# Patient Record
Sex: Male | Born: 1935 | Race: White | Hispanic: No | Marital: Married | Smoking: Current every day smoker
Health system: Southern US, Community
[De-identification: ages and names within clinical notes are randomized; demographics above are authoritative.]

## PROBLEM LIST (undated history)

## (undated) DIAGNOSIS — L409 Psoriasis, unspecified: Secondary | ICD-10-CM

## (undated) DIAGNOSIS — E039 Hypothyroidism, unspecified: Secondary | ICD-10-CM

## (undated) DIAGNOSIS — I639 Cerebral infarction, unspecified: Secondary | ICD-10-CM

## (undated) DIAGNOSIS — E785 Hyperlipidemia, unspecified: Secondary | ICD-10-CM

## (undated) DIAGNOSIS — I739 Peripheral vascular disease, unspecified: Secondary | ICD-10-CM

## (undated) DIAGNOSIS — I2510 Atherosclerotic heart disease of native coronary artery without angina pectoris: Secondary | ICD-10-CM

## (undated) DIAGNOSIS — I10 Essential (primary) hypertension: Secondary | ICD-10-CM

## (undated) DIAGNOSIS — K219 Gastro-esophageal reflux disease without esophagitis: Secondary | ICD-10-CM

## (undated) DIAGNOSIS — C801 Malignant (primary) neoplasm, unspecified: Secondary | ICD-10-CM

## (undated) DIAGNOSIS — J449 Chronic obstructive pulmonary disease, unspecified: Secondary | ICD-10-CM

## (undated) DIAGNOSIS — J45909 Unspecified asthma, uncomplicated: Secondary | ICD-10-CM

## (undated) DIAGNOSIS — R0602 Shortness of breath: Secondary | ICD-10-CM

## (undated) HISTORY — DX: Hyperlipidemia, unspecified: E78.5

## (undated) HISTORY — DX: Psoriasis, unspecified: L40.9

## (undated) HISTORY — DX: Peripheral vascular disease, unspecified: I73.9

## (undated) HISTORY — DX: Malignant (primary) neoplasm, unspecified: C80.1

## (undated) HISTORY — DX: Essential (primary) hypertension: I10

## (undated) HISTORY — DX: Shortness of breath: R06.02

## (undated) HISTORY — DX: Cerebral infarction, unspecified: I63.9

## (undated) HISTORY — PX: PROSTATECTOMY: SHX69

## (undated) HISTORY — PX: LOWER EXTREMITY ANGIOGRAM: SHX5955

## (undated) HISTORY — PX: VASCULAR SURGERY: SHX849

## (undated) HISTORY — PX: OTHER SURGICAL HISTORY: SHX169

## (undated) HISTORY — PX: CHOLECYSTECTOMY: SHX55

---

## 2001-12-08 HISTORY — PX: CAROTID ENDARTERECTOMY: SUR193

## 2004-12-08 HISTORY — PX: CAROTID ENDARTERECTOMY: SUR193

## 2005-03-26 ENCOUNTER — Encounter (INDEPENDENT_AMBULATORY_CARE_PROVIDER_SITE_OTHER): Payer: Self-pay | Admitting: *Deleted

## 2005-03-26 ENCOUNTER — Inpatient Hospital Stay (HOSPITAL_COMMUNITY): Admission: RE | Admit: 2005-03-26 | Discharge: 2005-03-27 | Payer: Self-pay | Admitting: Vascular Surgery

## 2005-11-06 ENCOUNTER — Ambulatory Visit (HOSPITAL_COMMUNITY): Admission: RE | Admit: 2005-11-06 | Discharge: 2005-11-06 | Payer: Self-pay | Admitting: Vascular Surgery

## 2007-03-02 ENCOUNTER — Ambulatory Visit: Payer: Self-pay | Admitting: Vascular Surgery

## 2007-03-11 ENCOUNTER — Ambulatory Visit: Payer: Self-pay | Admitting: Vascular Surgery

## 2007-03-11 ENCOUNTER — Ambulatory Visit (HOSPITAL_COMMUNITY): Admission: RE | Admit: 2007-03-11 | Discharge: 2007-03-11 | Payer: Self-pay | Admitting: Vascular Surgery

## 2007-03-17 ENCOUNTER — Encounter (INDEPENDENT_AMBULATORY_CARE_PROVIDER_SITE_OTHER): Payer: Self-pay | Admitting: Specialist

## 2007-03-17 ENCOUNTER — Inpatient Hospital Stay (HOSPITAL_COMMUNITY): Admission: RE | Admit: 2007-03-17 | Discharge: 2007-03-19 | Payer: Self-pay | Admitting: Vascular Surgery

## 2007-04-13 ENCOUNTER — Ambulatory Visit: Payer: Self-pay | Admitting: Vascular Surgery

## 2007-05-11 ENCOUNTER — Ambulatory Visit: Payer: Self-pay | Admitting: Vascular Surgery

## 2007-07-05 ENCOUNTER — Ambulatory Visit: Payer: Self-pay | Admitting: Vascular Surgery

## 2007-09-20 ENCOUNTER — Ambulatory Visit: Payer: Self-pay | Admitting: Vascular Surgery

## 2008-03-20 ENCOUNTER — Ambulatory Visit: Payer: Self-pay | Admitting: Vascular Surgery

## 2008-09-25 ENCOUNTER — Ambulatory Visit: Payer: Self-pay | Admitting: Vascular Surgery

## 2009-04-03 ENCOUNTER — Ambulatory Visit: Payer: Self-pay | Admitting: Vascular Surgery

## 2009-09-25 ENCOUNTER — Ambulatory Visit: Payer: Self-pay | Admitting: Vascular Surgery

## 2009-12-08 DIAGNOSIS — C801 Malignant (primary) neoplasm, unspecified: Secondary | ICD-10-CM

## 2009-12-08 HISTORY — DX: Malignant (primary) neoplasm, unspecified: C80.1

## 2009-12-08 HISTORY — PX: NEPHRECTOMY: SHX65

## 2011-04-22 NOTE — Procedures (Signed)
BYPASS GRAFT EVALUATION   INDICATION:  Followup peripheral vascular disease.   HISTORY:  Diabetes:  No.  Cardiac:  No.  Hypertension:  Yes.  Smoking:  Yes.  Previous Surgery:  Bilateral distal external iliac, common femoral,  profunda femoral, superficial femoral artery endarterectomies on  03/17/2007.   SINGLE LEVEL ARTERIAL EXAM                               RIGHT              LEFT  Brachial:                    165                170  Anterior tibial:             125                132  Posterior tibial:            113                132  Peroneal:  Ankle/brachial index:        0.74               0.78   PREVIOUS ABI:  Date: 03/20/2004  RIGHT:  0.59  LEFT:  0.66   LOWER EXTREMITY BYPASS GRAFT DUPLEX EXAM:   DUPLEX:  Biphasic to monophasic Doppler waveforms noted throughout the  bilateral distal external iliac, common femoral, profunda femoral, and  superficial femoral arteries with increased velocities of 274 cm/s noted  in the right distal external iliac artery and 306 cm/s noted in the left  distal external iliac artery.   IMPRESSION:  1. Patent bilateral endarterectomy sites with increased velocities      noted, as described above.  2. Stable bilateral ankle-brachial indices.   ___________________________________________  Quita Skye Hart Rochester, M.D.   CH/MEDQ  D:  09/25/2008  T:  09/25/2008  Job:  [id number]

## 2011-04-22 NOTE — Assessment & Plan Note (Signed)
OFFICE VISIT   BATTISTE, Nalu C  DOB:  1933-11-08                                       05/11/2007  ZOXWR#:[id number]   The patient is now 2 months status post bilateral extensive distal  external iliac, common femoral, proximal superficial femoral and  profundus femoris endarterectomy with Dacron patch angioplasties.  He  had an excellent clinical result with complete resolution of his  claudication symptoms.  He had some slow wound healing in the right  inguinal crease which looks much better today.  There is no evidence of  any purulence or deep infection or any other significant abnormalities  related to his arterial surgery.  He does have some mild edema in his  left leg distally which had improved since surgery.  He was encouraged  to continue to increase his activity as tolerated and will return to see  [place] on a p.r.n. basis.   Quita Skye Hart Rochester, M.D.  Electronically Signed   JDL/MEDQ  D:  05/11/2007  T:  05/12/2007  Job:  [id number]

## 2011-04-25 NOTE — Op Note (Signed)
[name], [name]               ACCOUNT NO.:  [id number]   MEDICAL RECORD NO.:  [id number]          PATIENT TYPE:  AMB   LOCATION:  SDS                          FACILITY:  [facility]   PHYSICIAN:  Quita Skye. Hart Rochester, M.D.  DATE OF BIRTH:  1933-08-01   DATE OF PROCEDURE:  11/06/2005  DATE OF DISCHARGE:  11/06/2005                                 OPERATIVE REPORT   PREOPERATIVE DIAGNOSIS:  Bilateral lower extremity claudication secondary to  aortoiliac and femoropopliteal occlusive disease.   PROCEDURE:  1.  Abdominal aortogram with bilateral lower extremity runoff via right      common femoral approach.  2.  Measurement of pressure gradient, right iliac system -- 8-mm gradient   SURGEON:  Quita Skye. Hart Rochester, M.D.   ANESTHESIA:  Local with Xylocaine.   CONTRAST:  182 mL.   COMPLICATIONS:  None.   DESCRIPTION OF PROCEDURE:  The patient was taken to the [facility]  Peripheral Endovascular Lab and placed in the supine position, at which time  both groins were prepped with Betadine scrubbing solution and draped in  routine sterile manner.  After infiltration with 1% Xylocaine, the right  common femoral artery was entered percutaneously and guidewire passed into  the suprarenal aorta under fluoroscopic guidance.  A 5-French sheath and  dilator were passed over the guidewire, dilator removed and a standard  pigtail catheter positioned in the suprarenal aorta.  Flush abdominal  aortogram was performed, injecting 20 mL of contrast at 20 mL per second.  This revealed the aorta to be widely patent, but mildly diffusely diseased  with atherosclerosis.  Right renal artery was widely patent with no  significant stenosis.  There was an approximate 70% stenosis of the proximal  portion of the left renal artery about 1 cm distal to the ostium.  The  inferior mesenteric artery was faintly visualized.  Both common iliac  arteries were diffusely diseased with mild atherosclerotic changes, but no  significant stenoses were noted.  The right internal iliac artery had a  severe stenosis at its origin and was diffusely diseased.  Additional views  of the renal arteries were obtained on magnification views with 1 additional  injection of 15 degrees of LAO projection confirming the above findings.  Catheter was withdrawn into the terminal aorta and bilateral lower extremity  runoff performed, injecting 88 mL of contrast at 8 mL per second.  Additional views of the iliac arteries were obtained using RAO and LAO  projections and also additional views of the popliteal arteries bilaterally  and the proximal runoff were obtained using the peakhold technique.  Both  common and external iliac arteries were diffusely mildly diseased, but no  significant stenoses noted.  On the right side, the superficial femoral  artery was diffusely diseased with mild plaque formation proximally and an  80% stenosis in the mid-thigh in the middle of some diffuse plaque.  The  right popliteal artery had an approximate 60% to 70% stenosis just above the  knee joint, but was smooth below the knee and the primary runoff on the  right was through the  peroneal artery with severe tibial disease.  On the  left side, there was a similar finding with the common femoral and profunda  femoris arteries be widely patent.  The superficial femoral artery had mild  diffuse disease with some focal stenotic lesion in the mid-thigh  approximating 80%.  There was a 70% stenosis just above the knee joint on  the left in the popliteal artery, with the below-knee popliteal artery being  widely patent and two-vessel runoff through the posterior tibial and  peroneal arteries with diffuse disease.  Following this, a pressure gradient  of the right iliac system was measured from the aorta to the common femoral  level with only an 8-mm gradient being present and no change in the wave  form characteristics.  Having tolerated the procedure  well, the sheath was  removed, adequate compression applied and no complications ensued.   FINDINGS:  1.  Mild diffuse aortoiliac occlusive disease with no significant stenoses.  2.  Severe stenosis, origin of right internal iliac artery.  3.  Seventy percent proximal left renal artery stenosis.  4.  Bilateral moderate to moderately severe superficial femoral occlusive      disease with 80% stenoses in the mid-thigh bilaterally with diffuse      plaque.  5.  Severe bilateral tibial disease with primarily one-vessel runoff on the      right through the peroneal and two-vessel runoff on the left through the      peroneal and posterior tibial arteries.           ______________________________  Quita Skye Hart Rochester, M.D.     JDL/MEDQ  D:  11/06/2005  T:  11/07/2005  Job:  [id number]

## 2011-04-25 NOTE — Op Note (Signed)
NAME:  [name], [name]               ACCOUNT NO.:  [id number]   MEDICAL RECORD NO.:  [id number]          PATIENT TYPE:  AMB   LOCATION:  SDS                          FACILITY:  [facility]   PHYSICIAN:  Quita Skye. Hart Rochester, M.D.  DATE OF BIRTH:  [birth year]   DATE OF PROCEDURE:  03/11/2007  DATE OF DISCHARGE:                               OPERATIVE REPORT   PREOPERATIVE DIAGNOSIS:  Severe claudication both lower extremities  secondary to aortoiliac femoral popliteal and tibial occlusive disease.   PROCEDURE:  1. Abdominal aortogram with bilateral lower extremity one and runoff      via right common femoral approach.   SURGEON:  Quita Skye. Hart Rochester, M.D.   ANESTHESIA:  Local Xylocaine.   CONTRAST:  225 mL.   COMPLICATIONS:  None.   DESCRIPTION OF PROCEDURE:  The patient was taken to [facility]  peripheral endovascular lab, placed in the supine position at which time  both groins were prepped with Betadine scrub solution draped in a  routine sterile manner.  After infiltration of 1% Xylocaine, right  common femoral artery was entered percutaneously.  Guidewire passed into  the suprarenal aorta under fluoroscopic guidance.  A 5-French sheath and  dilator were passed over the guide wire, dilator removed.  A standard  pigtail catheter positioned in the suprarenal aorta.  Flush abdominal  aortogram was performed at 20 mL contrast at 20 mL per second.  This  revealed the aorta to be patent but diffusely diseased with  atherosclerotic changes.  There are single renal arteries bilaterally  with approximate 60 to 70% proximal left renal artery stenosis.  Better  views of this were obtained by using a LAO projection and magnified  views.  The terminal aorta had no significant stenosis but was diffusely  involved with plaque.  Catheter was withdrawn into the terminal aorta  and pelvic angiography performed which revealed the common internal and  external iliacs to be patent but diffusely diseased with  no severe  stenotic lesions.  Following this bilateral lower extremity runoff was  performed at 88 mL of contrast at 8 mL per second.  This revealed the  right common femoral to have severe occlusive disease distally involving  the origin of the superficial femoral and the profunda.  This was  causing what appeared to be an 80% stenosis.  The right superficial  femoral artery was diffusely diseased with some 70-80% stenotic lesions  but not occluded in any area below-knee popliteal artery was patent and  there was essentially one-vessel runoff through the peroneal artery.  On  the left there was a similar picture with severe common femoral disease  down involving the origin of the superficial femoral artery.  Left  superficial femoral artery was patent throughout with diffuse disease  and multiple stenotic lesions and popliteal artery below the knee was  patent with two-vessel runoff to the peroneal and posterior tibial  arteries.  Additional views of the popliteal were obtained using the  peak hold technique and LAO and RAO projections of the iliacs were also  obtained.  Having tolerated procedure well, the catheter  was removed  over the guidewire, sheath removed, adequate compression applied.  No  complications ensued.   FINDINGS:  1. Diffuse aortoiliac occlusive disease with no stenotic lesions.  2. 60 to 70% left renal artery stenosis.  3. Severe bilateral common femoral stenotic lesions in the 70-80%      range.  4. Diffuse superficial femoral occlusive disease with no total      occlusions bilaterally.  5. Severe tibial disease with one-vessel runoff on the right via      peroneal and two-vessel runoff on the left via peroneal and      posterior tibial artery.      Quita Skye Hart Rochester, M.D.  Electronically Signed     JDL/MEDQ  D:  03/11/2007  T:  03/11/2007  Job:  [id number]

## 2011-04-25 NOTE — Op Note (Signed)
[name], MALECHA NO.:  [id number]   MEDICAL RECORD NO.:  [id number]          PATIENT TYPE:  INP   LOCATION:  2899                         FACILITY:  [facility]   PHYSICIAN:  Quita Skye. Hart Rochester, M.D.  DATE OF BIRTH:  [birth year]   DATE OF PROCEDURE:  DATE OF DISCHARGE:                                 OPERATIVE REPORT   BRIEF HISTORY:  The patient is previous, right carotid endarterectomy in  [place], [place] 3 years ago; and has been known to have moderate  left internal carotid flow reduction. A repeat duplex scan, now, reveals a  very severe left internal carotid stenosis, exceeding 95% with no evidence  of flow reduction in the right carotid endarterectomy site. He was scheduled  for left carotid endarterectomy of this asymptomatic lesion.   PREOP DIAGNOSIS:  Left internal carotid stenosis -- asymptomatic.   POSTOP DIAGNOSIS:  Left internal carotid stenosis -- asymptomatic.   OPERATION:  Left carotid endarterectomy with Dacron patch angioplasty.   SURGEON:  Dr. Hart Rochester   FIRST ASSISTANT:  Janetta Hora. Fields, MD   SECOND ASSISTANT:  Rowe Clack, P.A.-C.   ANESTHESIA:  General endotracheal.   PROCEDURE:  The patient was taken to the operating room, placed in the  supine position, at which time satisfactory general endotracheal anesthesia  was administered. Left neck was prepped with Betadine scrub and solution  draped in a routine sterile manner. Incision was made along the anterior  border of the sternocleidomastoid muscle and carried down through  subcutaneous tissue and platysma using the Bovie. The common facial vein and  external jugular vein were ligated with 3-0 silk ties and divided; exposing  the common internal and external carotid arteries. Care was taken not to  injure the vagus or hypoglossal nerves both of which were exposed. There was  a heavily calcified plaque at the origin of the internal carotid artery  extending up about 3 cm.   Distal vessel appeared normal, but had no palpable  pulse. A #10 shunt was prepared; and the patient was heparinized.   Carotid vessels were occluded with vascular clamps. Longitudinal opening  made in the common carotid with the 15 blade; and extended up the internal  carotid with the Potts scissors to a point distal to the disease. The plaque  was at least 90-95% stenotic in severity. There was excellent back bleeding  coming from the distal vessel. A #10 shunt was inserted about difficulty  reestablishing flow in about 2 minutes. Standard endarterectomy was then  performed using the elevator and the Potts scissors with an inversion  endarterectomy of the external carotid. The plaque feathered off the distal  internal carotid artery, nicely, not requiring any tacking sutures.   The lumen was thoroughly irrigated with heparin and saline; and all loose  debris carefully removed and the arteriotomy was closed with a patch using  continuous 6-0 Prolene. Prior to completion of the closure the shunt was  removed after about 30 minutes of shunt time. Following antegrade and  retrograde flushing, the closure was completed reestablishing the flow up  the external  branch and then up the internal branch. Carotid was occluded  for  less than 2 minutes for removal of the shunt. Protamine was given to reverse  the heparin. Following adequate hemostasis the wound was irrigated with  saline and closed in layers with Vicryl in a subcuticular fashion. Sterile  dressing applied. The patient taken to recovery room in a satisfactory  condition.      JDL/MEDQ  D:  03/26/2005  T:  03/26/2005  Job:  [id number]

## 2011-04-25 NOTE — Discharge Summary (Signed)
NAME:  [name], [name] NO.:  [id number]   MEDICAL RECORD NO.:  [id number]          PATIENT TYPE:  INP   LOCATION:  2007                         FACILITY:  [facility]   PHYSICIAN:  Quita Skye. Hart Rochester, M.D.  DATE OF BIRTH:  01-20-1933   DATE OF ADMISSION:  03/17/2007  DATE OF DISCHARGE:                               DISCHARGE SUMMARY   DISCHARGE DATE:  Tentatively 1-2 days.   ADMISSION DIAGNOSIS:  Severe claudication both lower extremities  secondary to aortoiliac femoral popliteal and tibial occlusive disease.   DISCHARGE DIAGNOSES:  1. Aortoiliac femoral popliteal and tibial occlusive disease, status      post bilateral common femoral endarterectomy with Dacron patch      angioplasty.  2. Extracranial cerebrovascular occlusive disease.  3. Hypertension.  4. Hyperlipidemia.  5. History of transient ischemic attack.  6. Psoriasis.   PROCEDURE:  On March 17, 2007, patient underwent external iliac artery  and common femoral artery and profunda endarterectomies with Dacron  patch angioplasty by Dr. Quita Skye. Hart Rochester.   HISTORY AND PHYSICAL:  This is a 77 year old white male with diffuse  bilateral peripheral vascular disease with complaints of bilateral lower  extremity claudication symptoms.  Patient was experiencing this over the  past 14 months and it has increased.  He recently underwent an abdominal  aortogram by Dr. Hart Rochester, which showed diffuse aortoiliac occlusive  disease and severe bilateral common femoral stenotic lesion in the 70-  80% range.  Please see dictated history and physical for further  details.   HOSPITAL COURSE:  Patient underwent bilateral external iliac artery,  common femoral artery, superficial femoral artery and profunda  endarterectomies by Dr. Hart Rochester without any complications.  Postoperatively, the patient is progressing quite well.  Postop day  number 1, patient's pain is well controlled.  His vital signs remained  stable.  He remains  afebrile.  Blood pressure 92/40 and he remains in  normal sinus rhythm.  Patient's JP drain is draining less than 5 mL over  the past 8 hours and this will be discontinued.  Patient does have  wheezes on exam, he will be given a Xopenex nebulizer treatment.  He is  to continue incentive spirometry aggressively and he will be given a  flutter valve.   The patient did have some acute blood loss anemia; however, he has not  required any blood transfusions.  Hemoglobin and hematocrit remain  stable.  Patient is to ambulate today with assistance.  Smoking  cessation was discussed with the patient.  He will have ABIs today and  transferred to 2000.   DISCHARGE DISPOSITION:  The patient will be discharged home in the next  1-2 days provided he remains clinically stable and is able to ambulate  at baseline.   MEDICATIONS:  1. Methotrexate 2.5 mg p.o. every Monday.  2. Zocor 20 mg 1/2 tab q.h.s.  3. Lisinopril 20 mg 1/2 tab daily.  4. Aspirin 81 mg p.o. daily.  5. Tylox 1-2 tabs every 4 hours p.r.n.   INSTRUCTIONS:  The patient instructed to follow a low-fat, low-salt  diet.  No  driving or heavy lifting greater than 10 pounds for 3 weeks.  Patient is to ambulate 3-4 times daily.  He is increase activity as  tolerated.  He may shower and clean his incisions with mild soap and  water.  He is to call our office if any problems shall arise such as  incision erythema, drainage, temp greater than 101.5.   FOLLOWUP:  The patient will follow up with Dr. Hart Rochester in 3 weeks with  ABIs, the office will contact him with time and day of appointment.      Constance Holster, PA      Quita Skye. Hart Rochester, M.D.  Electronically Signed    JMW/MEDQ  D:  03/18/2007  T:  03/18/2007  Job:  [id number]

## 2011-04-25 NOTE — H&P (Signed)
NAME:  [name] [name] NO.:  [id number]   MEDICAL RECORD NO.:  [id number]          PATIENT TYPE:  INP   LOCATION:                               FACILITY:  [facility]   PHYSICIAN:  Quita Skye. Hart Rochester, M.D.  DATE OF BIRTH:  1933-01-30   DATE OF ADMISSION:  03/26/2005  DATE OF DISCHARGE:                                HISTORY & PHYSICAL   CHIEF COMPLAINT:  Severe left internal carotid stenosis - asymptomatic.   HISTORY OF PRESENT ILLNESS:  [name] [name] is a 77 year old male patient who  has a history of right carotid endarterectomy performed at [place], [place], three years ago which was diagnosed after he had a presyncopal  type episode.  He has had some known moderate left internal carotid flow  reduction and has had no symptoms since his previous surgical procedure  including hemispheric or nonhemispheric TIAs, amaurosis fugax, diplopia,  blurred vision or syncope.  He had a carotid Duplex exam performed in  [place], [place], on February 26, 2005, which revealed a 95+% left  internal carotid stenosis and a widely patent right carotid endarterectomy  site.  This study was repeated at the [facility] office on March 25, 2005, with  similar findings and after discussing this with the patient, he decided to  proceed with left carotid surgery.   ALLERGIES:  PENICILLIN.   MEDICATIONS:  1.  Lisinopril 10 mg once a day.  2.  Simvastatin 20 mg once a day.  3.  Methotrexate 2.5 mg once a week.  4.  Aspirin 325 mg once a day.   PAST MEDICAL HISTORY:  1.  Hypertension.  2.  Negative for diabetes, coronary artery disease, congestive heart      failure, COPD or deep venous thrombosis.  3.  He does have a history of hyperlipidemia.   PAST SURGICAL HISTORY:  1.  Right carotid endarterectomy.  2.  Cholecystectomy.   FAMILY HISTORY:  Positive for coronary artery disease in his father who died  of a myocardial infarction.  Negative for diabetes and stroke.   SOCIAL  HISTORY:  He is married, has one child.  He is retired.  He is a  heavy tobacco abuser, smoking one and a half packs of cigarettes per day for  50+ years.  Does not use alcohol.   REVIEW OF SYMPTOMS:  Occasionally has a chronic morning cough but no  production or hemoptysis.  No weight loss or anorexia or change in general  status.  Denies any GI symptoms such as hematemesis, melena, peptic ulcer  disease or dysphagia.  Does have some lower extremity symptoms complaining  that both legs give out when walking, particularly in the thighs but no rest  pain or nonhealing ulcers.   PHYSICAL EXAMINATION:  GENERAL APPEARANCE:  He is a male patient who is in  no apparent distress.  He is alert and oriented x3.  VITAL SIGNS:  Blood pressure 154/68, heart rate 60, respirations 16.  NECK:  Supple with 3+ carotid pulses.  There is a harsh bruit on the left  and a soft bruit  on the right.  Right neck incision is well healed.  There  is no palpable adenopathy in the neck.  CHEST:  Clear to auscultation.  No wheezing is noted.  CARDIOVASCULAR:  Regular rhythm with no murmurs.  ABDOMEN:  Soft and nontender with no masses.  He has 2+ femoral pulses bilaterally with no palpable distal pulses.  There  is no evidence of edema or ischemia.  EXTREMITIES:  Upper extremity pulses are 2 to 3+ in the brachial and radial  level bilaterally.  NEUROLOGIC:  Normal.   IMPRESSION:  1.  Severe (greater than 95%) left internal carotid stenosis - asymptomatic,      status post right carotid endarterectomy three years ago.  2.  Hypertension.  3.  Hyperlipidemia.   PLAN:  Admit the patient on March 26, 2005, for an elective left carotid  endarterectomy.  Risks have been fully discussed with the patient and he  would like to proceed with surgery at this time.       ___________________________________________  Quita Skye Hart Rochester, M.D.    JDL/MEDQ  D:  03/25/2005  T:  03/25/2005  Job:  [id number]   cc:   Wyonia Hough, M.D.

## 2011-04-25 NOTE — H&P (Signed)
NAME:  [name], [name] NO.:  [id number]   MEDICAL RECORD NO.:  [id number]          PATIENT TYPE:  INP   LOCATION:  NA                           FACILITY:  [facility]   PHYSICIAN:  Quita Skye. Hart Rochester, M.D.  DATE OF BIRTH:  [birth year]   DATE OF ADMISSION:  03/17/2007  DATE OF DISCHARGE:                              HISTORY & PHYSICAL   CHIEF COMPLAINT:  Bilateral lower extremity claudication symptoms.   HISTORY AND PHYSICAL:  This is a 77 year old Caucasian male with a past  medical history of extracranial cerebrovascular occlusive disease,  hypertension, hyperlipidemia with complaints of significant increase in  claudication symptoms of his bilateral extremities over the past 14  months.  The patient complains that he can only ambulate about 50 yards  and he has severe discomfort in his legs.  He complains of his pain  primarily in the calf and thigh area.  He denies any radiation of this  sensation.  He complains of a burning pain and heaviness.  He denies  rest pain, any lower extremity ulceration and radiation of the pain.  He  denies peripheral edema, fever, chills, numbness in the lower  extremities, decrease in motor function, chest pain, shortness of  breath, dyspnea on exertion, CVA/TIA symptoms, syncope, vision changes.  Patient does complain of a tingling sensation in his bilateral toes.  He  does complain of a decrease in temperature in his lower extremities  bilaterally.  Denies decrease in sensation.   Patient saw Dr. Hart Rochester at the beginning of March of 2008 and the patient  had ABIs done.  They showed that he has dropped from 0.77 to 0.64 on the  right and from 0.80 to 0.51 on the left.  Patient was set up for an  abdominal aortogram with bilateral lower extremity runoff on March 11, 2007.  The results of this show diffuse aortoiliac occlusive disease and  no stenotic lesions, 60% to 70% left renal artery stenosis, severe  bilateral common femoral stenotic  lesions in the 70% to 80% and diffuse  superficial femoral occlusive disease and no total occlusions  bilaterally.  Patient also had severe two-vessel disease with __________-  vessel runoff in the right via peroneal and two-vessel runoff via the  peroneal and posterior tibial artery.  It was best thought that the  patient undergo elective bilateral femoral endarterectomies to help  relieve his symptoms.   PAST MEDICAL HISTORY:  1. Extracranial cerebrovascular occlusive disease.  2. Hypertension.  3. Hyperlipidemia.  4. Tobacco abuse.  5. Psoriasis.  6. Transient ischemic attack in 1994 and 1995.   PAST SURGICAL HISTORY:  1. Left carotid endarterectomy in April of 2006.  2. Right carotid endarterectomy in 2003 in [place], [place].  3. Cholecystectomy in 1989.   ALLERGIES:  PATIENT IS ALLERGIC TO PENICILLIN, REACTION RASH AND  ITCHING.   MEDICATIONS:  The patient does not have a list available.  He states his  medications have not changed since he has last been here.  He does take:  1. Aspirin 81 mg daily.  2. Lisinopril 10 mg p.o.  daily.  3. Zocor 20 mg p.o. daily.  4. Methotrexate 2.5 mg once a week.  The patient states he will bring a list with him prior to his surgery.   REVIEW OF SYSTEMS:  See HPI for pertinent positives and negatives;  otherwise, negative for diabetes mellitus, congestive heart failure,  COPD, DVTs, renal insufficiency.   SOCIAL HISTORY:  Patient is married and has 1 child.  He lives with his  family.  Patient is retired and does still drive.  He complains of  smoking 1 to 1-1/2 packs of cigarettes a day for the past 51 years.  He  denies alcohol use.   FAMILY HISTORY:  Patient has a positive family history of coronary  artery disease with his father deceased due to a myocardial infarction.  He denies family history of cerebrovascular accident and diabetes  mellitus.   PHYSICAL EXAMINATION:  VITAL SIGNS:  Blood pressure 110/65.  Heart  rate  72.  Respirations 16.  GENERAL:  This is a 77 year old Caucasian male not in acute distress.  HEENT:  Normocephalic, atraumatic.  Pupils equal, round and reactive to  light and accommodation.  Extraocular movements intact.  Oral mucosa  pink and moist.  Sclerae nonicteric.  NECK:  Supple.  A couple of carotids.  No carotid bruits heard on  auscultation bilaterally.  RESPIRATORY:  Symmetrical on inspiration, unlabored and clear to  auscultation bilaterally.  CARDIAC:  Regular rate and rhythm.  No murmur, gallop or rub.  ABDOMEN:  Soft, nontender, nondistended.  Normoactive bowel sounds x4.  GU/RECTAL:  Deferred.  EXTREMITIES:  No lower extremity edema.  His lower extremities are cool  bilaterally.  He has 2+ radial pulses bilaterally, 1+ femoral pulses  bilaterally.  No popliteal, dorsalis pedis or posterior tibial pulses  felt.  NEUROLOGIC:  Nonfocal.  Alert and oriented x4.  Gait is steady.  Muscle  strength 5+ bilaterally and throughout.  Deep tendon reflexes 2+ and  symmetrical.  Patient has positive motor and sensory in his lower  extremities, intact.   ASSESSMENT:  1. Aortoiliac occlusive disease.  2. Severe bilateral common femoral stenotic lesions, 70% to 80%.   PLAN:  1. We will admit the patient to [facility] on March 17, 2007,      by Dr. Candie Chroman service.  2. The patient will undergo a bilateral femoral endarterectomy.  3. The risks and benefits were explained to the patient in great      detail.  Dr. Hart Rochester had seen the patient prior to admission and is      in agreement with the above.      Constance Holster, PA      Quita Skye. Hart Rochester, M.D.  Electronically Signed    JMW/MEDQ  D:  03/16/2007  T:  03/16/2007  Job:  [id number]

## 2011-04-25 NOTE — Op Note (Signed)
[name], [name] NO.:  [id number]   MEDICAL RECORD NO.:  [id number]          PATIENT TYPE:  INP   LOCATION:  2899                         FACILITY:  [facility]   PHYSICIAN:  Quita Skye. Hart Rochester, M.D.  DATE OF BIRTH:  [birth year]   DATE OF PROCEDURE:  03/17/2007  DATE OF DISCHARGE:                               OPERATIVE REPORT   PREOPERATIVE DIAGNOSIS:  Severe claudication in both legs secondary to  severe bilateral distal external iliac, common femoral, superficial  femoral and profunda femoris atherosclerosis.   POSTOPERATIVE DIAGNOSIS:  Severe claudication in both legs secondary to  severe bilateral distal external iliac, common femoral, superficial  femoral and profunda femoris atherosclerosis.   OPERATION:  Bilateral extensive distal external iliac, common femoral,  proximal superficial femoral and profunda femoris endarterectomies with  Dacron patch angioplasty.   SURGEON:  Quita Skye. Hart Rochester, M.D.   FIRST ASSISTANT:  Constance Holster, [place]   ANESTHESIA:  General endotracheal.   PROCEDURE:  The patient was taken to the operating room and placed in  the supine position, at which time satisfactory general endotracheal  anesthesia was administered.  Both groins were prepped were Betadine  scrub and solution and draped in a routine sterile manner.  Longitudinal  incisions were made in both inguinal regions, carried down through  subcutaneous tissue, and the external iliac arteries were exposed well  up beneath the inguinal ligaments bilaterally, as were the common  femorals, the superficial femorals down about 6-7 cm, and the profunda  femoris arteries down past their secondary branches.  All were encircled  with vessel loops.  There was diffuse calcific atherosclerosis involving  all of these vessels, which became better more proximally up beneath the  inguinal ligament where the proximal external iliac artery had a good  pulse but both common femoral arteries  were pulseless.  The extensive  plaque extended well down into the superficial femoral arteries but  there were a few soft areas about 5 cm distal to the origin, and the  profunda femoris arteries were most severely diseased proximally and  they became softer distally.  The patient was heparinized.  Beginning on  the left side vessels were occluded with vascular clamps, a longitudinal  opening made in the common femoral, extended up well into the external  iliac proximally to a point proximal to the most severe disease.  The  plaque itself was essentially totally occlusive in the common femoral  artery.  Arteriotomy was extended down into the superficial femoral  artery about 5 or 6 cm to get distal to the worst disease.  An  endarterectomy was then performed beginning in the superficial femoral  artery and working proximally.  The vessel was so thin at the crotch  between the profunda and the superficial femoral artery that this  required extending the arteriotomy down the profunda as well and  excising some thin vessel between the superficial and profunda femoris.  Following extensive endarterectomy well up the inguinal ligament into  the external iliac artery, there was good inflow present and good  backbleeding coming from below.  The  medial wall of the superficial  femoral and profunda were approximated with 6-0 Prolene and then after  removal of all loose debris. a long patch was sewn into place with 5-0  Prolene.  This created a new profunda, which following completion of the  anastomosis had excellent Doppler flow and the superficial femoral also  had excellent Doppler flow.  When this was completed attention was  turned to the right side, where a very similar procedure was performed.  After dissecting free all vessels and occluding them with vascular  clamps, a longitudinal opening made in the common femoral, extended well  up the external iliac proximally and down the superficial  femoral  distally, although the plaque on the right came out of the orifice of  the profunda better.  I did have a distal tail of plaque, which was  removed.  After completing the extensive endarterectomy and removing all  loose debris, a large patch was sewn into place on the right side with 5-  0 Prolene, clamps released, and there was excellent pulse and Doppler  flow bilaterally.  Protamine was given to reverse the heparin.  Following adequate hemostasis, a Jackson-Pratt drain was brought out  through inferiorly-based stab wounds bilaterally, secured with silk  sutures, and the wound closed in layers with Vicryl in a subcuticular  fashion.  Sterile dressing applied.  The patient taken to the recovery  room in satisfactory condition.      Quita Skye Hart Rochester, M.D.  Electronically Signed     JDL/MEDQ  D:  03/17/2007  T:  03/17/2007  Job:  [id number]

## 2011-04-25 NOTE — Discharge Summary (Signed)
[name], [name] NO.:  [id number]   MEDICAL RECORD NO.:  [id number]          PATIENT TYPE:  INP   LOCATION:  3313                         FACILITY:  [facility]   PHYSICIAN:  Quita Skye. Hart Rochester, M.D.  DATE OF BIRTH:  1933-02-28   DATE OF ADMISSION:  03/26/2005  DATE OF DISCHARGE:  03/27/2005                                 DISCHARGE SUMMARY   ADMISSION DIAGNOSIS:  Left internal carotid artery stenosis, asymptomatic.   DISCHARGE/SECONDARY DIAGNOSES:  1.  Left internal carotid artery stenosis, asymptomatic, status post carotid      endarterectomy.  2.  Hypertension.  3.  Hyperlipidemia.  4.  History of right carotid endarterectomy in 2003 in [place], [place].  5.  History of cholecystectomy in 1989.  6.  History of psoriasis.   BRIEF HISTORY:  [name] is a 77 year old Caucasian male with a previous  history of carotid artery disease, having undergone a right carotid  endarterectomy in [place], [place], three years ago after  experiencing a presyncopal-type episode.  He has had some known moderate  left internal carotid flow reduction and has had no symptoms since his  previous surgical procedure, including nonhemispheric TIAs, amaurosis fugax,  diplopia, blurred vision or syncope.  He had a carotid duplex performed in  [place], [place], on February 26, 2005, which revealed a 95% left  internal carotid artery stenosis and a widely patent left carotid  endarterectomy site.  This study was repeated in the CVTS office on March 25, 2005, with similar findings.  After discussing plans for elective left  carotid endarterectomy for reduction of stroke wrist, the patient agreed to  proceed.  This was tentatively scheduled for March 26, 2005, by Quita Skye.  Hart Rochester, M.D.   PROCEDURES:  On April 19,2006, he underwent left carotid endarterectomy with  Dacron patch angioplasty by Quita Skye. Hart Rochester, M.D.   HOSPITAL COURSE:  As anticipated, Mr.  [name] was electively admitted to  [facility] [place] on March 26, 2005.  He underwent a left carotid  endarterectomy with Dacron patch angioplasty.  He was extubated  neurologically intact.  After a short stay in the recovery room, he was  transferred to unit 3300 stepdown unit.   On the morning of postoperative day #1, [name] had remained  hemodynamically stable.  He was afebrile with a blood pressure of 135/60,  heart rate in sinus rhythm in the 70s and 80s and was saturating about 90%  on room air.  His Foley catheter had been removed and he was voiding without  difficulty.  He was tolerating normal diet as well.  He denied pain,  dysphagia, as well as nausea or vomiting.  On exam, his heart had a regular  rate and rhythm.  His lungs were relatively clear with an intermittent faint  wheeze in the left base.  His abdominal exam was benign.  Neurologically he  remained intact and was moving all extremities strong and symmetrically.  His tongue was midline.  His left neck incision was clean and dry without  erythema.  There was mild swelling, but no definitive hematoma.  Laboratory  results showed white blood cell count 7.6, hemoglobin 13.6, hematocrit 39.9,  platelet count 207.  Sodium 139, potassium 3.9, blood glucose 100, BUN 8,  creatinine 1.0.  Later that afternoon, [name] was able to ambulate  without difficulty and tolerated a regular diet.  Therefore, it was felt  that he was stable for discharge home.   DISCHARGE MEDICATIONS:  1.  Aspirin 325 mg one p.o. daily.  2.  Simvastatin 20 mg one p.o. daily.  3.  Methotrexate 2.5 mg once weekly.  4.  Lisinopril 10 mg one p.o. daily.  5.  Tylox one to two tablets p.o. q.6h. p.r.n. pain.   ACTIVITY:  He is to avoid driving or heavy lifting x 2 weeks.  He was  encouraged to increase his daily walking as tolerated.   DIET:  He is to follow a low-fat, low-salt, balanced diet.   WOUND CARE:  He may shower and clean his  incisions gently with soap and  water starting March 28, 2005.  He should notify the CVTS office if he  develops fever greater than 101 degrees or redness or drainage from his  incision site or any changes in his neurological status.   FOLLOWUP:  He is to follow up with Dr. Hart Rochester at the CVTS office in  approximately two weeks.  He will be contacted with a specific appointment  date and time.      AWZ/MEDQ  D:  03/27/2005  T:  03/27/2005  Job:  [id number]   cc:   Jacqualine Mau  [address]    [place] [address]  Fax: [phone]

## 2011-05-27 ENCOUNTER — Ambulatory Visit (INDEPENDENT_AMBULATORY_CARE_PROVIDER_SITE_OTHER): Payer: PRIVATE HEALTH INSURANCE | Admitting: Vascular Surgery

## 2011-05-27 ENCOUNTER — Encounter (INDEPENDENT_AMBULATORY_CARE_PROVIDER_SITE_OTHER): Payer: PRIVATE HEALTH INSURANCE

## 2011-05-27 DIAGNOSIS — I739 Peripheral vascular disease, unspecified: Secondary | ICD-10-CM

## 2011-05-27 DIAGNOSIS — Z48812 Encounter for surgical aftercare following surgery on the circulatory system: Secondary | ICD-10-CM

## 2011-05-27 DIAGNOSIS — I70219 Atherosclerosis of native arteries of extremities with intermittent claudication, unspecified extremity: Secondary | ICD-10-CM

## 2011-05-27 NOTE — Procedures (Unsigned)
LOWER EXTREMITY ARTERIAL DUPLEX  INDICATION:  Followup bilateral endarterectomies.  HISTORY: Diabetes:  No. Cardiac:  No. Hypertension:  Yes. Smoking:  Yes. Previous Surgery:  Bilateral distal external iliac, common femoral, profunda femoral and superficial femoral artery endarterectomies performed 03/17/2007.  SINGLE LEVEL ARTERIAL EXAM                         RIGHT                LEFT Brachial:               190                  184 Anterior tibial:        104                  97 Posterior tibial:       101                  116 Peroneal: Ankle/Brachial Index:   0.55                 0.61  LOWER EXTREMITY ARTERIAL DUPLEX EXAM  DUPLEX:  Biphasic to monophasic waveforms noted throughout the bilateral external iliac, common femoral, profunda femoral and superficial femoral arteries.  IMPRESSION: 1. Patent bilateral endarterectomy sites with velocity measurements     noted on the following worksheet. 2. Slightly declined ankle brachial indices.  ___________________________________________ Quita Skye Hart Rochester, M.D.  EM/MEDQ  D:  05/27/2011  T:  05/27/2011  Job:  [id number]

## 2011-05-27 NOTE — Consult Note (Signed)
NEW PATIENT CONSULTATION  [name], [name] DOB:  Oct 24, 1933                                       05/27/2011 FAOZH#:[id number]  The patient is a patient well known to me from the past having undergone right carotid endarterectomy in [place] many years ago, a left carotid endarterectomy by me, bilateral extensive iliac endarterectomies and common femoral endarterectomies for severe claudication.  His claudication symptoms have gradually worsened over the years with some calf discomfort at 1 block which requires him to stop and rest for a few minutes before proceeding.  He has no history of infection, nonhealing ulcers, gangrene or rest pain.  He also denies any hemiparesis, aphasia, amaurosis fugax, diplopia, blurred vision, syncope or other neurologic symptoms at this time.  CHRONIC MEDICAL PROBLEMS: 1. Hypertension. 2. Hyperlipidemia. 3. Status post right nephrectomy for cancer March of 2011 at [facility]. 4. Status post prostatectomy. 5. Negative for coronary artery disease, diabetes or stroke.  SOCIAL HISTORY:  He is married, has 1 child.  He is retired.  He has continued to smoke a pack and a half of cigarettes per day for 65 years. Does not use alcohol.  FAMILY HISTORY:  Negative for coronary artery disease, diabetes and stroke.  REVIEW OF SYSTEMS:  Positive primarily for leg discomfort.  Does not have chest pain, dyspnea on exertion, PND, orthopnea or other musculoskeletal type symptoms.  All other systems are negative in complete review of systems.  PHYSICAL EXAM:  Vital signs:  Blood pressure 171/69, heart rate 72, respirations 14.  General:  He is a well-developed, well-nourished male in no apparent distress, alert and oriented x3.  HEENT:  Exam normal for age.  EOMs intact.  Lungs:  Clear to auscultation.  No rhonchi or wheezing.  Cardiovascular:  Regular rhythm.  No murmurs.  Carotid pulses 3+.  No bruits are audible.  Abdomen:   Obese.  No palpable masses. Musculoskeletal:  Exam is free of major deformities.  Neurological: Normal.  Skin:  Free of rashes.  Lower extremities:  Reveals 3+ femoral pulses bilaterally with no popliteal or distal pulses.  Both feet are adequately perfused.  Today I ordered duplex scan and ABIs of the lower extremities.  ABIs have drifted down slightly from his previous study, 0.55 on the right and 0.61 on the left.  He does have diffuse superficial femoral and tibial disease accounting for his decreased ABIs and his symptoms.  Currently he is not having enough symptoms to warrant femoral-popliteal bypass grafting or further evaluation.  We will see him back in 1 year for further followup and also check carotid duplex exam at that time since that has not been followed for several years.    Quita Skye Hart Rochester, M.D. Electronically Signed  JDL/MEDQ  D:  05/27/2011  T:  05/27/2011  Job:  [id number]

## 2012-01-09 ENCOUNTER — Other Ambulatory Visit (INDEPENDENT_AMBULATORY_CARE_PROVIDER_SITE_OTHER): Payer: PRIVATE HEALTH INSURANCE | Admitting: Vascular Surgery

## 2012-01-09 DIAGNOSIS — I6529 Occlusion and stenosis of unspecified carotid artery: Secondary | ICD-10-CM

## 2012-01-09 DIAGNOSIS — Z48812 Encounter for surgical aftercare following surgery on the circulatory system: Secondary | ICD-10-CM

## 2012-01-14 NOTE — Procedures (Unsigned)
CAROTID DUPLEX EXAM  INDICATION:  Carotid artery stenosis  HISTORY: Diabetes:  No Cardiac:  No Hypertension:  Yes Smoking:  Currently Previous Surgery:  Left carotid endarterectomy on 03/26/2005; right carotid endarterectomy in [place], [place], in 2003 CV History:  Asymptomatic Amaurosis Fugax No, Paresthesias No, Hemiparesis No                                      RIGHT             LEFT Brachial systolic pressure:         120               110 Brachial Doppler waveforms:         Within normal limits                Within normal limits Vertebral direction of flow:        Retrograde        Antegrade DUPLEX VELOCITIES (cm/sec) CCA peak systolic                   155               113 ECA peak systolic                   164               163 ICA peak systolic                   99                84 ICA end diastolic                   24                22 PLAQUE MORPHOLOGY:                  N/V               N/V PLAQUE AMOUNT:                      N/V               N/V PLAQUE LOCATION:                    N/V               N/V  IMPRESSION: 1. Patent bilateral internal carotid arteries with history of     endarterectomy, no hyperplasia or hemodynamically significant     stenosis was identified. 2. Bilateral external carotid arteries present with stenosis. 3. Right vertebral artery is retrograde with elevated peak systolic     velocity of 342 cm present in the right subclavian artery. 4. Left vertebral artery is antegrade. 5. Minimal change present since previous study done on 05/05/2006.  ___________________________________________ Quita Skye. Hart Rochester, M.D.  SH/MEDQ  D:  01/09/2012  T:  01/09/2012  Job:  [id number]

## 2012-05-24 ENCOUNTER — Encounter: Payer: Self-pay | Admitting: Neurosurgery

## 2012-05-24 ENCOUNTER — Encounter: Payer: Self-pay | Admitting: Vascular Surgery

## 2012-05-25 ENCOUNTER — Encounter: Payer: Self-pay | Admitting: Neurosurgery

## 2012-05-25 ENCOUNTER — Encounter (INDEPENDENT_AMBULATORY_CARE_PROVIDER_SITE_OTHER): Payer: PRIVATE HEALTH INSURANCE | Admitting: *Deleted

## 2012-05-25 ENCOUNTER — Ambulatory Visit (INDEPENDENT_AMBULATORY_CARE_PROVIDER_SITE_OTHER): Payer: PRIVATE HEALTH INSURANCE | Admitting: Neurosurgery

## 2012-05-25 VITALS — BP 159/71 | HR 72 | Resp 16 | Ht 68.0 in | Wt 209.6 lb

## 2012-05-25 DIAGNOSIS — I739 Peripheral vascular disease, unspecified: Secondary | ICD-10-CM

## 2012-05-25 DIAGNOSIS — Z48812 Encounter for surgical aftercare following surgery on the circulatory system: Secondary | ICD-10-CM

## 2012-05-25 DIAGNOSIS — I6529 Occlusion and stenosis of unspecified carotid artery: Secondary | ICD-10-CM

## 2012-05-25 NOTE — Progress Notes (Signed)
[facility] [place] HISTORY AND PHYSICAL   CC: Annual lower extremity ABI for known peripheral vascular disease Referring Physician: Hart Rochester  History of Present Illness: 78 year old male patient of Dr. Hart Rochester underwent bilateral distal EIA, CFA PFA a and S FA endarterectomies in April 2008. The patient states he does have some pain with walking and at rest but is no worse than it has been in the past. Patient still able to function take care of his ADLs without difficulty. He reports no change in his walking distance or his pain level.  Past Medical History  Diagnosis Date  . Hypertension   . Hyperlipidemia   . Peripheral vascular disease   . Cancer 2011    Right kidney   . SOB (shortness of breath) on exertion   . Stroke     TIA  . Psoriasis     ROS: [x]  Positive   [ ]  Denies    General: [ ]  Weight loss, [ ]  Fever, [ ]  chills Neurologic: [ ]  Dizziness, [ ]  Blackouts, [ ]  Seizure [ ]  Stroke, [ ]  "Mini stroke", [ ]  Slurred speech, [ ]  Temporary blindness; [ ]  weakness in arms or legs, [ ]  Hoarseness Cardiac: [ ]  Chest pain/pressure, [ x] Shortness of breath at rest [x ] Shortness of breath with exertion, [ ]  Atrial fibrillation or irregular heartbeat Vascular: [x ] Pain in legs with walking, [ ]  Pain in legs at rest, [ ]  Pain in legs at night,  [ ]  Non-healing ulcer, [ ]  Blood clot in vein/DVT,   Pulmonary: [ ]  Home oxygen, [ ]  Productive cough, [ ]  Coughing up blood, [ ]  Asthma,  [ ]  Wheezing Musculoskeletal:  [ ]  Arthritis, [ ]  Low back pain, [ ]  Joint pain Hematologic: [ ]  Easy Bruising, [ ]  Anemia; [ ]  Hepatitis Gastrointestinal: [ ]  Blood in stool, [ ]  Gastroesophageal Reflux/heartburn, [ ]  Trouble swallowing Urinary: [ ]  chronic Kidney disease, [ ]  on HD - [ ]  MWF or [ ]  TTHS, [ ]  Burning with urination, [ ]  Difficulty urinating Skin: [ ]  Rashes, [ ]  Wounds Psychological: [ ]  Anxiety, [ ]  Depression   Social History History  Substance Use Topics  .  Smoking status: Current Everyday Smoker -- 1.5 packs/day  . Smokeless tobacco: Not on file  . Alcohol Use: No    Family History No family history on file.  Allergies  Allergen Reactions  . Penicillins     Current Outpatient Prescriptions  Medication Sig Dispense Refill  . aspirin 81 MG tablet Take 81 mg by mouth daily.      . clotrimazole (LOTRIMIN) 1 % cream Apply topically 2 (two) times daily.      . hydrochlorothiazide (MICROZIDE) 12.5 MG capsule Take 12.5 mg by mouth daily.      Marland Kitchen lisinopril (PRINIVIL,ZESTRIL) 20 MG tablet Take 20 mg by mouth daily.      . methotrexate (RHEUMATREX) 2.5 MG tablet Take 2.5 mg by mouth once a week. Caution:Chemotherapy. Protect from light.      . pravastatin (PRAVACHOL) 20 MG tablet Take 20 mg by mouth daily.        Physical Examination  Filed Vitals:   05/25/12 1142  BP: 159/71  Pulse: 72  Resp: 16    Body mass index is 31.87 kg/(m^2).  General:  WDWN in NAD Gait: Normal HEENT: WNL Eyes: Pupils equal Pulmonary: normal non-labored breathing , without Rales, rhonchi,  wheezing Cardiac: RRR, without  Murmurs, rubs or  gallops; No carotid bruits Abdomen: soft, NT, no masses Skin: no rashes, ulcers noted Vascular Exam/Pulses: 2+ radial pulses bilaterally, 3+ femoral pulses bilaterally, lower extremity pulses are not palpable  Extremities without ischemic changes, no Gangrene , no cellulitis; no open wounds;  Musculoskeletal: no muscle wasting or atrophy  Neurologic: A&O X 3; Appropriate Affect ; SENSATION: normal; MOTOR FUNCTION:  moving all extremities equally. Speech is fluent/normal  Non-Invasive Vascular Imaging: ABIs show a monophasic flow bilaterally he is 0.46 on the right, 0. 62 on the left which is slightly changed from previous  ASSESSMENT/PLAN: This is a patient with a long history of peripheral vascular disease with no change in his symptomology. I discussed the ABIs with Dr. Hart Rochester and we will bring the patient back in one  year for repeat lower extremity ABIs and less the patient worsens before that time. The patient is in agreement with this, his questions were encouraged and answered.  Lauree Chandler ANP  Clinic M.D.: Hart Rochester

## 2012-05-26 NOTE — Addendum Note (Signed)
Addended by: Sharee Pimple on: 05/26/2012 09:31 AM   Modules accepted: Orders

## 2012-05-26 NOTE — Procedures (Unsigned)
LOWER EXTREMITY ARTERIAL DUPLEX  INDICATION:  Follow up lower extremity endarterectomy.  HISTORY: Diabetes:  No. Cardiac: Hypertension:  Yes. Smoking:  Yes. Previous Surgery:  Bilateral distal EIA, CFA, PFA, and SFA endarterectomies performed 03/17/2007.  SINGLE LEVEL ARTERIAL EXAM                         RIGHT                LEFT Brachial: Anterior tibial: Posterior tibial: Peroneal: Ankle/Brachial Index:   0.46                 0.62.  LOWER EXTREMITY ARTERIAL DUPLEX EXAM  PREVIOUS ANKLE BRACHIAL INDEX 05/27/2011:  Right 0.55, left 0.61  DUPLEX:  IMPRESSION: 1. Right ankle brachial index suggestive of severe arterial disease     and has declined compared to the study on 05/27/2011. 2. Left ankle brachial index suggestive of moderate arterial disease     and has remained stable. 3. Monophasic waveforms noted throughout the bilateral external iliac,     common femoral, profunda femoris, and superficial femoral arteries. 4. Please see the following diagram for exam details.  ___________________________________________ Quita Skye Hart Rochester, M.D.  EM/MEDQ  D:  05/26/2012  T:  05/26/2012  Job:  [id number]

## 2012-09-15 ENCOUNTER — Telehealth: Payer: Self-pay

## 2012-09-15 DIAGNOSIS — L98499 Non-pressure chronic ulcer of skin of other sites with unspecified severity: Secondary | ICD-10-CM

## 2012-09-15 NOTE — Telephone Encounter (Addendum)
Pt. called to report having 2 sores on left shin that won't heal.  States one area has been present x 1 mo., and 2nd open area has been present x 2 weeks.  States " a little redness surrounding sores and some drainage."  Reports increased pain w/ walking.  Denies any rest pain.  Last evaluation was 05/2012.  Will discuss w/ provider re: need for repeat vascular lab. [name]

## 2012-09-15 NOTE — Telephone Encounter (Signed)
Discussed pt's symptoms with Azalia Bilis, NP.  Rec'd v.o. to schedule ABI's and office visit for evaluation.

## 2012-09-27 ENCOUNTER — Encounter: Payer: Self-pay | Admitting: Vascular Surgery

## 2012-09-28 ENCOUNTER — Encounter (INDEPENDENT_AMBULATORY_CARE_PROVIDER_SITE_OTHER): Payer: Medicare Other | Admitting: *Deleted

## 2012-09-28 ENCOUNTER — Ambulatory Visit (INDEPENDENT_AMBULATORY_CARE_PROVIDER_SITE_OTHER): Payer: PRIVATE HEALTH INSURANCE | Admitting: Vascular Surgery

## 2012-09-28 ENCOUNTER — Other Ambulatory Visit: Payer: Self-pay | Admitting: *Deleted

## 2012-09-28 ENCOUNTER — Encounter: Payer: Self-pay | Admitting: *Deleted

## 2012-09-28 ENCOUNTER — Encounter: Payer: Self-pay | Admitting: Vascular Surgery

## 2012-09-28 VITALS — BP 169/67 | HR 71 | Resp 18 | Ht 68.0 in | Wt 210.0 lb

## 2012-09-28 DIAGNOSIS — Z01818 Encounter for other preprocedural examination: Secondary | ICD-10-CM

## 2012-09-28 DIAGNOSIS — L98499 Non-pressure chronic ulcer of skin of other sites with unspecified severity: Secondary | ICD-10-CM | POA: Insufficient documentation

## 2012-09-28 DIAGNOSIS — I70209 Unspecified atherosclerosis of native arteries of extremities, unspecified extremity: Secondary | ICD-10-CM

## 2012-09-28 DIAGNOSIS — I739 Peripheral vascular disease, unspecified: Secondary | ICD-10-CM | POA: Insufficient documentation

## 2012-09-28 DIAGNOSIS — I7025 Atherosclerosis of native arteries of other extremities with ulceration: Secondary | ICD-10-CM | POA: Insufficient documentation

## 2012-09-28 NOTE — Progress Notes (Signed)
Subjective:     Patient ID: [name], male   DOB: 05/29/1933, 79 y.o.   MRN: [id number]  HPI this 79-year-old male returns today with a history of nonhealing ulcer in the left leg over the past 6 weeks. The ulcer occurred spontaneously. He has a remote history of extensive external iliac, common femoral, profunda femoris endarterectomies bilaterally. He was found to have significant aortoiliac occlusive disease and femoral popliteal occlusive disease in 2008. His ABIs have been relatively stable in the 0.5 range bilaterally. He is able to ambulate less than one block. He continues to smoke a pack and a half of cigarettes per day and has done so for 60+ years. He has no history of coronary artery disease to date.  Past Medical History  Diagnosis Date  . Hypertension   . Hyperlipidemia   . Peripheral vascular disease   . Cancer 2011    Right kidney   . SOB (shortness of breath) on exertion   . Stroke     TIA  . Psoriasis     History  Substance Use Topics  . Smoking status: Current Every Day Smoker -- 1.5 packs/day  . Smokeless tobacco: Not on file  . Alcohol Use: No    No family history on file.  Allergies  Allergen Reactions  . Penicillins     Current outpatient prescriptions:aspirin 325 MG tablet, Take 325 mg by mouth daily., Disp: , Rfl: ;  clotrimazole (LOTRIMIN) 1 % cream, Apply topically 2 (two) times daily., Disp: , Rfl: ;  hydrochlorothiazide (MICROZIDE) 12.5 MG capsule, Take 12.5 mg by mouth daily., Disp: , Rfl: ;  lisinopril (PRINIVIL,ZESTRIL) 20 MG tablet, Take 20 mg by mouth daily., Disp: , Rfl:  methotrexate (RHEUMATREX) 2.5 MG tablet, Take 2.5 mg by mouth once a week. Caution:Chemotherapy. Protect from light., Disp: , Rfl: ;  pravastatin (PRAVACHOL) 20 MG tablet, Take 20 mg by mouth daily., Disp: , Rfl: ;  aspirin 81 MG tablet, Take 81 mg by mouth daily., Disp: , Rfl:   BP 169/67  Pulse 71  Resp 18  Ht 5' 8" (1.727 m)  Wt 210 lb (95.255 kg)  BMI 31.93  kg/m2  Body mass index is 31.93 kg/(m^2).          Review of Systems complains of dyspnea on exertion and urinary incontinence, orthopnea, leg edema, COPD. Denies chest pain    Objective:   Physical Exam blood pressure 169/67 heart rate 71 respirations 18 Gen.-alert and oriented x3 in no apparent distress HEENT normal for age Lungs no rhonchi -mild bilateral wheezing Cardiovascular regular rhythm no murmurs carotid pulses 3+ palpable no bruits audible Abdomen soft nontender no palpable masses Musculoskeletal free of  major deformities Skin clear -no rashes Neurologic normal Lower extremities 2+ femoral on the right 2-3+ femoral on the left. Absent popliteal and distal pulses. 2 superficial ulcerations in the distal aspect anterior aspect of the left leg measuring about a centimeter and a half in diameter. No cellulitis or purulence is noted.  Val ordered lower extremity arterial Dopplers which are reviewed and interpreted. ABI on the right is 0.41 on the left is 0.59.    Assessment:     Ischemic ulcer left distal leg secondary to combination of aortoiliac femoral popliteal and tibial occlusive disease-status post extensive distal external iliac common femoral profunda femoris endarterectomy in 2008   ongoing tobacco abuse Urinary incontinence secondary to prostatectomy Plan:     #1 Will schedule abdominal aortogram with bilateral lower extremity runoff   on Tuesday, October 29 by Dr. Brabham. Any improvement which is feasible by PTA and stenting will be considered at that time. Patient may require a left femoral-popliteal bypass grafting in order to heal ulcers. #2 Will schedule Cardiolite since patient may well have underlying coronary artery disease with diffuse atherosclerosis and ongoing tobacco abuse #3 patient will likely need Foley catheter during angiogram      

## 2012-09-29 NOTE — Addendum Note (Signed)
Addended by: Sharee Pimple on: 09/29/2012 10:06 AM   Modules accepted: Orders

## 2012-09-29 NOTE — Addendum Note (Signed)
Addended by: Sharee Pimple on: 09/29/2012 08:44 AM   Modules accepted: Orders

## 2012-10-04 MED ORDER — SODIUM CHLORIDE 0.9 % IV SOLN
INTRAVENOUS | Status: DC
  Administered 2012-10-05: 1000 mL via INTRAVENOUS

## 2012-10-05 ENCOUNTER — Encounter (HOSPITAL_COMMUNITY)
Admission: RE | Disposition: A | Discharge status: Home / Self Care | Payer: Self-pay | Source: Ambulatory Visit | Attending: Surgery

## 2012-10-05 ENCOUNTER — Ambulatory Visit (HOSPITAL_COMMUNITY)
Admission: RE | Admit: 2012-10-05 | Discharge: 2012-10-06 | Disposition: A | Discharge status: Home / Self Care | Payer: Medicare Other | Source: Ambulatory Visit | Attending: Surgery | Admitting: Surgery

## 2012-10-05 ENCOUNTER — Encounter (HOSPITAL_COMMUNITY): Payer: Self-pay | Admitting: *Deleted

## 2012-10-05 DIAGNOSIS — L98499 Non-pressure chronic ulcer of skin of other sites with unspecified severity: Secondary | ICD-10-CM | POA: Insufficient documentation

## 2012-10-05 DIAGNOSIS — Z23 Encounter for immunization: Secondary | ICD-10-CM | POA: Insufficient documentation

## 2012-10-05 DIAGNOSIS — I739 Peripheral vascular disease, unspecified: Secondary | ICD-10-CM | POA: Insufficient documentation

## 2012-10-05 DIAGNOSIS — L97909 Non-pressure chronic ulcer of unspecified part of unspecified lower leg with unspecified severity: Secondary | ICD-10-CM | POA: Insufficient documentation

## 2012-10-05 DIAGNOSIS — I10 Essential (primary) hypertension: Secondary | ICD-10-CM | POA: Insufficient documentation

## 2012-10-05 HISTORY — DX: Atherosclerotic heart disease of native coronary artery without angina pectoris: I25.10

## 2012-10-05 HISTORY — PX: LOWER EXTREMITY ANGIOGRAM: SHX5508

## 2012-10-05 HISTORY — DX: Gastro-esophageal reflux disease without esophagitis: K21.9

## 2012-10-05 HISTORY — PX: ABDOMINAL AORTAGRAM: SHX5454

## 2012-10-05 HISTORY — DX: Chronic obstructive pulmonary disease, unspecified: J44.9

## 2012-10-05 HISTORY — DX: Unspecified asthma, uncomplicated: J45.909

## 2012-10-05 LAB — POCT I-STAT, CHEM 8
BUN: 38 mg/dL — ABNORMAL HIGH (ref 6–23)
Calcium, Ion: 1.22 mmol/L (ref 1.13–1.30)
Creatinine, Ser: 2.1 mg/dL — ABNORMAL HIGH (ref 0.50–1.35)
Glucose, Bld: 99 mg/dL (ref 70–99)
HCT: 39 % (ref 39.0–52.0)
Hemoglobin: 13.3 g/dL (ref 13.0–17.0)
Potassium: 4.7 mEq/L (ref 3.5–5.1)
Sodium: 138 mEq/L (ref 135–145)
TCO2: 24 mmol/L (ref 0–100)

## 2012-10-05 SURGERY — ABDOMINAL AORTAGRAM
Anesthesia: LOCAL

## 2012-10-05 MED ORDER — LEVOTHYROXINE SODIUM 75 MCG PO TABS
75.0000 ug | ORAL_TABLET | Freq: Every day | ORAL | Status: DC
  Filled 2012-10-05 (×2): qty 1

## 2012-10-05 MED ORDER — METHOTREXATE 2.5 MG PO TABS: 2.5000 mg | ORAL_TABLET | ORAL | Status: DC

## 2012-10-05 MED ORDER — INFLUENZA VIRUS VACC SPLIT PF IM SUSP
0.5000 mL | INTRAMUSCULAR | Status: AC
  Administered 2012-10-06: 0.5 mL via INTRAMUSCULAR
  Filled 2012-10-05: qty 0.5

## 2012-10-05 MED ORDER — LABETALOL HCL 5 MG/ML IV SOLN
10.0000 mg | INTRAVENOUS | Status: DC | PRN
  Filled 2012-10-05: qty 4

## 2012-10-05 MED ORDER — SODIUM CHLORIDE 0.9 % IV SOLN
INTRAVENOUS | Status: DC
  Administered 2012-10-05: 19:00:00 via INTRAVENOUS

## 2012-10-05 MED ORDER — LOSARTAN POTASSIUM 50 MG PO TABS
100.0000 mg | ORAL_TABLET | Freq: Every day | ORAL | Status: DC
  Administered 2012-10-05 – 2012-10-06 (×2): 100 mg via ORAL
  Filled 2012-10-05 (×2): qty 2

## 2012-10-05 MED ORDER — FENTANYL CITRATE 0.05 MG/ML IJ SOLN
INTRAMUSCULAR | Status: AC
  Filled 2012-10-05: qty 2

## 2012-10-05 MED ORDER — ASPIRIN 325 MG PO TABS
325.0000 mg | ORAL_TABLET | Freq: Every day | ORAL | Status: DC
  Administered 2012-10-05 – 2012-10-06 (×2): 325 mg via ORAL
  Filled 2012-10-05 (×2): qty 1

## 2012-10-05 MED ORDER — DOXAZOSIN MESYLATE 2 MG PO TABS
2.0000 mg | ORAL_TABLET | Freq: Every day | ORAL | Status: DC
  Filled 2012-10-05 (×2): qty 1

## 2012-10-05 MED ORDER — LIDOCAINE HCL (PF) 1 % IJ SOLN
INTRAMUSCULAR | Status: AC
  Filled 2012-10-05: qty 30

## 2012-10-05 MED ORDER — SIMVASTATIN 5 MG PO TABS
5.0000 mg | ORAL_TABLET | Freq: Every day | ORAL | Status: DC
  Administered 2012-10-05: 5 mg via ORAL
  Filled 2012-10-05 (×2): qty 1

## 2012-10-05 MED ORDER — METOPROLOL TARTRATE 1 MG/ML IV SOLN: 2.0000 mg | INTRAVENOUS | Status: DC | PRN

## 2012-10-05 MED ORDER — ONDANSETRON HCL 4 MG/2ML IJ SOLN: 4.0000 mg | Freq: Four times a day (QID) | INTRAMUSCULAR | Status: DC | PRN

## 2012-10-05 MED ORDER — ACETAMINOPHEN 325 MG PO TABS: 325.0000 mg | ORAL_TABLET | ORAL | Status: DC | PRN

## 2012-10-05 MED ORDER — GUAIFENESIN-DM 100-10 MG/5ML PO SYRP: 15.0000 mL | ORAL_SOLUTION | ORAL | Status: DC | PRN

## 2012-10-05 MED ORDER — ACETAMINOPHEN 650 MG RE SUPP: 325.0000 mg | RECTAL | Status: DC | PRN

## 2012-10-05 MED ORDER — HYDRALAZINE HCL 20 MG/ML IJ SOLN
10.0000 mg | INTRAMUSCULAR | Status: DC | PRN
  Filled 2012-10-05: qty 0.5

## 2012-10-05 MED ORDER — MIDAZOLAM HCL 2 MG/2ML IJ SOLN
INTRAMUSCULAR | Status: AC
  Filled 2012-10-05: qty 2

## 2012-10-05 MED ORDER — ALUM & MAG HYDROXIDE-SIMETH 200-200-20 MG/5ML PO SUSP: 15.0000 mL | ORAL | Status: DC | PRN

## 2012-10-05 MED ORDER — HEPARIN (PORCINE) IN NACL 2-0.9 UNIT/ML-% IJ SOLN
INTRAMUSCULAR | Status: AC
  Filled 2012-10-05: qty 1000

## 2012-10-05 MED ORDER — PHENOL 1.4 % MT LIQD
1.0000 | OROMUCOSAL | Status: DC | PRN
  Filled 2012-10-05: qty 177

## 2012-10-05 NOTE — Interval H&P Note (Signed)
History and Physical Interval Note:  10/05/2012 10:42 AM  [name]  has presented today for surgery, with the diagnosis of PVD  The various methods of treatment have been discussed with the patient and family. After consideration of risks, benefits and other options for treatment, the patient has consented to  Procedure(s) (LRB) with comments: ABDOMINAL AORTAGRAM (N/A) as a surgical intervention .  The patient's history has been reviewed, patient examined, no change in status, stable for surgery.  I have reviewed the patient's chart and labs.  Questions were answered to the patient's satisfaction.     BRABHAM IV, V. WELLS

## 2012-10-05 NOTE — H&P (View-Only) (Signed)
Subjective:     Patient ID: [name], male   DOB: 13-Oct-1933, 78 y.o.   MRN: [id number]  HPI this 78 year old male returns today with a history of nonhealing ulcer in the left leg over the past 6 weeks. The ulcer occurred spontaneously. He has a remote history of extensive external iliac, common femoral, profunda femoris endarterectomies bilaterally. He was found to have significant aortoiliac occlusive disease and femoral popliteal occlusive disease in 2008. His ABIs have been relatively stable in the 0.5 range bilaterally. He is able to ambulate less than one block. He continues to smoke a pack and a half of cigarettes per day and has done so for 60+ years. He has no history of coronary artery disease to date.  Past Medical History  Diagnosis Date  . Hypertension   . Hyperlipidemia   . Peripheral vascular disease   . Cancer 2011    Right kidney   . SOB (shortness of breath) on exertion   . Stroke     TIA  . Psoriasis     History  Substance Use Topics  . Smoking status: Current Every Day Smoker -- 1.5 packs/day  . Smokeless tobacco: Not on file  . Alcohol Use: No    No family history on file.  Allergies  Allergen Reactions  . Penicillins     Current outpatient prescriptions:aspirin 325 MG tablet, Take 325 mg by mouth daily., Disp: , Rfl: ;  clotrimazole (LOTRIMIN) 1 % cream, Apply topically 2 (two) times daily., Disp: , Rfl: ;  hydrochlorothiazide (MICROZIDE) 12.5 MG capsule, Take 12.5 mg by mouth daily., Disp: , Rfl: ;  lisinopril (PRINIVIL,ZESTRIL) 20 MG tablet, Take 20 mg by mouth daily., Disp: , Rfl:  methotrexate (RHEUMATREX) 2.5 MG tablet, Take 2.5 mg by mouth once a week. Caution:Chemotherapy. Protect from light., Disp: , Rfl: ;  pravastatin (PRAVACHOL) 20 MG tablet, Take 20 mg by mouth daily., Disp: , Rfl: ;  aspirin 81 MG tablet, Take 81 mg by mouth daily., Disp: , Rfl:   BP 169/67  Pulse 71  Resp 18  Ht 5\' 8"  (1.727 m)  Wt 210 lb (95.255 kg)  BMI 31.93  kg/m2  Body mass index is 31.93 kg/(m^2).          Review of Systems complains of dyspnea on exertion and urinary incontinence, orthopnea, leg edema, COPD. Denies chest pain    Objective:   Physical Exam blood pressure 169/67 heart rate 71 respirations 18 Gen.-alert and oriented x3 in no apparent distress HEENT normal for age Lungs no rhonchi -mild bilateral wheezing Cardiovascular regular rhythm no murmurs carotid pulses 3+ palpable no bruits audible Abdomen soft nontender no palpable masses Musculoskeletal free of  major deformities Skin clear -no rashes Neurologic normal Lower extremities 2+ femoral on the right 2-3+ femoral on the left. Absent popliteal and distal pulses. 2 superficial ulcerations in the distal aspect anterior aspect of the left leg measuring about a centimeter and a half in diameter. No cellulitis or purulence is noted.  Val ordered lower extremity arterial Dopplers which are reviewed and interpreted. ABI on the right is 0.41 on the left is 0.59.    Assessment:     Ischemic ulcer left distal leg secondary to combination of aortoiliac femoral popliteal and tibial occlusive disease-status post extensive distal external iliac common femoral profunda femoris endarterectomy in 2008   ongoing tobacco abuse Urinary incontinence secondary to prostatectomy Plan:     #1 Will schedule abdominal aortogram with bilateral lower extremity runoff  on Tuesday, October 29 by Dr. Myra Gianotti. Any improvement which is feasible by PTA and stenting will be considered at that time. Patient may require a left femoral-popliteal bypass grafting in order to heal ulcers. #2 Will schedule Cardiolite since patient may well have underlying coronary artery disease with diffuse atherosclerosis and ongoing tobacco abuse #3 patient will likely need Foley catheter during angiogram

## 2012-10-05 NOTE — Op Note (Signed)
[facility] [place]  Patient name: [name] MRN: [id number] DOB: 11-29-1933 Sex: male  10/05/2012 Pre-operative Diagnosis: Left leg ulcer Post-operative diagnosis:  Same Surgeon:  Jorge Ny Procedure Performed:  1.  ultrasound access right femoral artery  2.  abdominal aortogram  3.  left leg runoff  4.  second order catheterization    Indications:  The patient has a history of bilateral femoral endarterectomies with patch angioplasty. He now has a nonhealing ulcer on the left leg.  Procedure:  The patient was identified in the holding area and taken to room 8.  The patient was then placed supine on the table and prepped and draped in the usual sterile fashion.  A time out was called.  Ultrasound was used to evaluate the right common femoral artery.  It was patent .  A digital ultrasound image was acquired.  A micropuncture needle was used to access the right common femoral artery under ultrasound guidance.  An 018 wire was advanced without resistance and a micropuncture sheath was placed.  The 018 wire was removed and a benson wire was placed.  The micropuncture sheath was exchanged for a 5 french sheath.  An omniflush catheter was advanced over the wire to the level of L-1.  An abdominal angiogram was obtained.  Next, using the omniflush catheter and a benson wire, the aortic bifurcation was crossed and the catheter was placed into theleft external iliac artery and left runoff was obtained.   Findings:   Aortogram:  The visualized portions of the suprarenal abdominal aorta showed no significant disease. There is aneurysmal changes to the infrarenal abdominal aorta which are not well defined by this study. The right renal artery is not visualized. Mild to moderate stenosis is seen at the origin of bilateral common iliac arteries. External iliac arteries bilaterally are patent. Patulous dilatation of the common femoral artery is seen consistent with  previous surgery  Right Lower Extremity:  Not visualized  Left Lower Extremity:  The left common femoral artery is patent throughout it's course. There is patulous dilatation consistent with previous patch angioplasty. Profunda femoral artery is patent throughout it's course. The superficial femoral artery is occluded. There is reconstitution of a diseased above-knee popliteal artery. The below knee popliteal artery is patent. It appears disease-free. The posterior tibial artery is the dominant runoff. The peroneal artery is patent at the ankle however there does appear to be disease in its proximal portion which is not well defined. The anterior tibial artery is occluded.  Intervention:  None  Impression:  #1  occluded left superficial femoral artery with reconstitution of a diseased above-knee popliteal artery. The below knee popliteal artery appears disease-free. The dominant runoff to the posterior tibial artery. The peroneal artery is also visualized out to the ankle. There is a questionable significant stenosis at its origin.  #2  a total of 34 cc of contrast was administered today due to the patient's renal disease.   Juleen China, M.D. [facility] [place] Office: [phone] Pager:  [phone]

## 2012-10-06 DIAGNOSIS — Z01810 Encounter for preprocedural cardiovascular examination: Secondary | ICD-10-CM

## 2012-10-06 LAB — BASIC METABOLIC PANEL
BUN: 31 mg/dL — ABNORMAL HIGH (ref 6–23)
CO2: 28 mEq/L (ref 19–32)
Chloride: 102 mEq/L (ref 96–112)
Glucose, Bld: 85 mg/dL (ref 70–99)
Potassium: 4.9 mEq/L (ref 3.5–5.1)

## 2012-10-06 MED ORDER — METHOTREXATE 2.5 MG PO TABS: 12.5000 mg | ORAL_TABLET | ORAL | Status: DC

## 2012-10-06 NOTE — Progress Notes (Signed)
[facility] Progress Note  10/06/2012 7:36 AM POD 1  Subjective:  No complaints  Afebrile x 24 hrs VSS  Filed Vitals:   10/06/12 0552  BP: 132/76  Pulse: 72  Temp: 98.4 F (36.9 C)  Resp: 18    Physical Exam: Incisions:  Right groin soft without hematoma   CBC    Component Value Date/Time   HGB 13.3 10/05/2012 0738   HCT 39.0 10/05/2012 0738    BMET    Component Value Date/Time   NA 135 10/06/2012 0425   K 4.9 10/06/2012 0425   CL 102 10/06/2012 0425   CO2 28 10/06/2012 0425   GLUCOSE 85 10/06/2012 0425   BUN 31* 10/06/2012 0425   CREATININE 1.90* 10/06/2012 0425   CALCIUM 8.7 10/06/2012 0425   GFRNONAA 32* 10/06/2012 0425   GFRAA 38* 10/06/2012 0425    INR No results found for this basename: inr     Intake/Output Summary (Last 24 hours) at 10/06/12 0736 Last data filed at 10/06/12 0658  Gross per 24 hour  Intake   2035 ml  Output   1975 ml  Net     60 ml     Assessment/Plan:  78 y.o. male is s/p  1. ultrasound access right femoral artery  2. abdominal aortogram  3. left leg runoff  4. second order catheterization   POD 1  -Cr improved this am to 1.9 from 2.1 -discharge home this am  -vein mapping before discharge -pt for left fem pop in the next few weeks.  Doreatha Massed, PA-C [facility] [phone] 10/06/2012 7:36 AM

## 2012-10-06 NOTE — Progress Notes (Signed)
Patient ID: [name], male   DOB: [birth year], 78 y.o.   MRN: [id number] Vascular Surgery Progress Note  Subjective: Nonhealing ulcer left pretibial region with femoral-popliteal occlusive disease  Objective:  Filed Vitals:   10/06/12 0552  BP: 132/76  Pulse: 72  Temp: 98.4 F (36.9 C)  Resp: 18    General alert and oriented x3 in no apparent distress 2 circular ulcers remain in left lower extremity in pretibial region which are uninfected-have been present for 4-6 weeks with no improvement     Labs:  Lab 10/06/12 0425 10/05/12 0738  CREATININE 1.90* 2.10*    Lab 10/06/12 0425 10/05/12 0738  NA 135 138  K 4.9 4.7  CL 102 103  CO2 28 --  BUN 31* 38*  CREATININE 1.90* 2.10*  LABGLOM -- --  GLUCOSE 85 --  CALCIUM 8.7 --    Lab 10/05/12 0738  WBC --  HGB 13.3  HCT 39.0  PLT --   No results found for this basename: INR:3 in the last 168 hours  I/O last 3 completed shifts: In: 2035 [P.O.:720; I.V.:1315] Out: 1975 [Urine:1975]  Imaging: No results found.  Assessment/Plan:  POD #1  LOS: 1 day  s/p Procedure(s): ABDOMINAL AORTAGRAM LOWER EXTREMITY ANGIOGRAM  Patient needs a left femoral-popliteal bypass grafting in order to heal these ulcerations. We'll get vein mapping today of great saphenous vein and then patient to be discharged. We'll schedule over the next 2 weeks. Discussed with patient and he understands and would like to proceed   Josephina Gip, MD 10/06/2012 8:16 AM

## 2012-10-06 NOTE — Progress Notes (Signed)
VASCULAR LAB PRELIMINARY  PRELIMINARY  PRELIMINARY  PRELIMINARY* Vascular Ultrasound Left lower extremity vein mapping has been completed.    Left Lower Extremity Vein Map    Left Great Saphenous Vein   Segment Diameter Comment  1. Origin 8.48mm 1.76mm deep  2. High Thigh 4.21mm 14.68mm deep  3. Mid Thigh 3.68mm 10.17mm deep  4. Low Thigh 2.60mm 3.38mm deep  5. At Knee 3.19mm 3 mm deep  6. High Calf 2.31mm 4.19mm deep. Branch  7. Low Calf 2.36mm 7.25mm deep.  Branch  8. Ankle 2.27mm 2.9mm deep. Branch   mm    mm    mm     Left Small Saphenous Vein  Segment Diameter Comment  1. Origin 1.11mm 7.28mm deep  2. High Calf 1.17mm 5.92mm deep  3. Low Calf 1.41mm 4.68mm deep  4. Ankle 1.70mm 4.2mm deep   mm    mm    mm        Farrel Demark, RDMS, RVT  10/06/2012, 12:18 PM

## 2012-10-06 NOTE — Progress Notes (Signed)
Pt d/c home with instructions and f/u appointment with Dr. Hart Rochester and stress test tomorrow, pt verbalized understanding, home with wife, escorted out by [employer]

## 2012-10-06 NOTE — Discharge Summary (Signed)
[facility] Discharge Summary  [name] [birth year] 78 y.o. male  [id number]  Admission Date: 10/05/2012  Discharge Date: 10/06/12  Physician: Nada Libman, MD  Admission Diagnosis: PVD   HPI:   This is a 78 y.o. male returns today with a history of nonhealing ulcer in the left leg over the past 6 weeks. The ulcer occurred spontaneously. He has a remote history of extensive external iliac, common femoral, profunda femoris endarterectomies bilaterally. He was found to have significant aortoiliac occlusive disease and femoral popliteal occlusive disease in 2008. His ABIs have been relatively stable in the 0.5 range bilaterally. He is able to ambulate less than one block. He continues to smoke a pack and a half of cigarettes per day and has done so for 60+ years. He has no history of coronary artery disease to date.  Hospital Course:  The patient was admitted to the hospital and taken to the operating room on 10/05/2012 and underwent  1. ultrasound access right femoral artery  2. abdominal aortogram  3. left leg runoff  4. second order catheterization  The pt tolerated the procedure well and was transported to the PACU in good condition. He was admitted to the hospital b/c of his Creatinine, which was 2.1 for IVF.  By POD 1, his Cr was 1.9.  The pt right groin was soft and no hematoma noted.  The remainder of the hospital course consisted of increasing mobilization and increasing intake of solids without difficulty.  CBC    Component Value Date/Time   HGB 13.3 10/05/2012 0738   HCT 39.0 10/05/2012 0738    BMET    Component Value Date/Time   NA 135 10/06/2012 0425   K 4.9 10/06/2012 0425   CL 102 10/06/2012 0425   CO2 28 10/06/2012 0425   GLUCOSE 85 10/06/2012 0425   BUN 31* 10/06/2012 0425   CREATININE 1.90* 10/06/2012 0425   CALCIUM 8.7 10/06/2012 0425   GFRNONAA 32* 10/06/2012 0425   GFRAA 38* 10/06/2012 0425     Discharge Instructions:    The patient is discharged to home with extensive instructions on wound care and progressive ambulation.  They are instructed not to drive or perform any heavy lifting until returning to see the physician in his office.  Discharge Orders    Future Appointments: Provider: Department: Dept Phone: Center:   10/07/2012 8:15 AM [facility] ([facility] [phone] None   05/25/2013 10:00 AM [facility] [phone] [facility]   05/25/2013 11:00 AM [facility] [phone] [facility]   05/25/2013 11:40 AM Evern Bio, NP [facility] [phone] [facility]     Future Orders Please Complete By Expires   Resume previous diet      Lifting restrictions      Comments:   No lifting for 2 weeks   Call MD for:  temperature >100.5      Call MD for:  redness, tenderness, or signs of infection (pain, swelling, bleeding, redness, odor or green/yellow discharge around incision site)      Call MD for:  severe or increased pain, loss or decreased feeling  in affected limb(s)      Discharge wound care:      Comments:   Shower daily with soap and water starting today, 10/06/12. Remove bandage when you get home.      Discharge Diagnosis:  PVD  Secondary Diagnosis: Patient Active Problem List  Diagnosis  . Peripheral vascular disease, unspecified  .  PVD (peripheral vascular disease)  . 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 Hypertension   . Hyperlipidemia   . Peripheral vascular disease   . Cancer 2011    Right kidney   . SOB (shortness of breath) on exertion   . Stroke     TIA  . Psoriasis   . Coronary artery disease   . COPD (chronic obstructive pulmonary disease)   . Asthma   . GERD (gastroesophageal reflux disease)       [name], [name]  Home Medication Instructions ZOX:[id number]   Printed on:10/06/12 8119  Medication Information                      methotrexate (RHEUMATREX) 2.5 MG tablet Take 2.5 mg by mouth once a week. Caution:Chemotherapy. Protect from light.           aspirin 325 MG tablet Take 325 mg by mouth daily.           levothyroxine (SYNTHROID, LEVOTHROID) 75 MCG tablet Take 75 mcg by mouth daily.           pravastatin (PRAVACHOL) 40 MG tablet Take 40 mg by mouth daily.           losartan (COZAAR) 100 MG tablet Take 100 mg by mouth daily.           folic acid (FOLVITE) 400 MCG tablet Take 400 mcg by mouth daily.           fluocinonide ointment (LIDEX) 0.05 % Apply topically 2 (two) times daily.           doxazosin (CARDURA) 2 MG tablet Take 2 mg by mouth at bedtime.             Disposition: home  Patient's condition: is Good  Follow up: 1. Dr. Hart Rochester in 2 weeks   Doreatha Massed, PA-C [facility] [phone] 10/06/2012  7:43 AM

## 2012-10-07 ENCOUNTER — Ambulatory Visit (HOSPITAL_COMMUNITY): Payer: Medicare Other | Attending: Vascular Surgery | Admitting: Radiology

## 2012-10-07 VITALS — BP 129/60 | HR 73 | Ht 68.0 in | Wt 206.0 lb

## 2012-10-07 DIAGNOSIS — F17200 Nicotine dependence, unspecified, uncomplicated: Secondary | ICD-10-CM | POA: Insufficient documentation

## 2012-10-07 DIAGNOSIS — R0989 Other specified symptoms and signs involving the circulatory and respiratory systems: Secondary | ICD-10-CM | POA: Insufficient documentation

## 2012-10-07 DIAGNOSIS — I4949 Other premature depolarization: Secondary | ICD-10-CM

## 2012-10-07 DIAGNOSIS — Z8249 Family history of ischemic heart disease and other diseases of the circulatory system: Secondary | ICD-10-CM | POA: Insufficient documentation

## 2012-10-07 DIAGNOSIS — Z01818 Encounter for other preprocedural examination: Secondary | ICD-10-CM

## 2012-10-07 DIAGNOSIS — I10 Essential (primary) hypertension: Secondary | ICD-10-CM | POA: Insufficient documentation

## 2012-10-07 DIAGNOSIS — I739 Peripheral vascular disease, unspecified: Secondary | ICD-10-CM | POA: Insufficient documentation

## 2012-10-07 DIAGNOSIS — R0609 Other forms of dyspnea: Secondary | ICD-10-CM | POA: Insufficient documentation

## 2012-10-07 DIAGNOSIS — Z01810 Encounter for preprocedural cardiovascular examination: Secondary | ICD-10-CM

## 2012-10-07 DIAGNOSIS — R0602 Shortness of breath: Secondary | ICD-10-CM

## 2012-10-07 DIAGNOSIS — L98499 Non-pressure chronic ulcer of skin of other sites with unspecified severity: Secondary | ICD-10-CM

## 2012-10-07 MED ORDER — REGADENOSON 0.4 MG/5ML IV SOLN
0.4000 mg | Freq: Once | INTRAVENOUS | Status: AC
  Administered 2012-10-07: 0.4 mg via INTRAVENOUS

## 2012-10-07 MED ORDER — TECHNETIUM TC 99M SESTAMIBI GENERIC - CARDIOLITE
33.0000 | Freq: Once | INTRAVENOUS | Status: AC | PRN
  Administered 2012-10-07: 33 via INTRAVENOUS

## 2012-10-07 MED ORDER — TECHNETIUM TC 99M SESTAMIBI GENERIC - CARDIOLITE
11.0000 | Freq: Once | INTRAVENOUS | Status: AC | PRN
  Administered 2012-10-07: 11 via INTRAVENOUS

## 2012-10-07 NOTE — Progress Notes (Signed)
[facility] SITE 3 NUCLEAR MED [address] [id number] [place] [address] [phone]  Cardiology Nuclear Med Study  [name] is a 78 y.o. male     MRN : [id number]     DOB: [birth year]  Procedure Date: 10/07/2012  Nuclear Med Background Indication for Stress Test:  Evaluation for Ischemia, and Pending Surgical Clearance for  (L) Femoral-Popliteal Bypass Graft by Dr. Josephina Gip History:  No previously documented CAD. Cardiac Risk Factors: Carotid Disease, Claudication, CVA, Family History - CAD, Hypertension, Lipids, PVD and Smoker  Symptoms:  DOE   Nuclear Pre-Procedure Caffeine/Decaff Intake:  None > 12 hrs NPO After: 7:30pm   Lungs:  Clear. O2 Sat: 96% on room air. IV 0.9% NS with Angio Cath:  22g  IV Site: R Wrist x 1, tolerated well IV Started by:  Irean Hong, RN  Chest Size (in):  44 Cup Size: n/a  Height: 5\' 8"  (1.727 m)  Weight:  206 lb (93.441 kg)  BMI:  Body mass index is 31.32 kg/(m^2). Tech Comments:  AM medications taken    Nuclear Med Study 1 or 2 day study: 1 day  Stress Test Type:  Eugenie Birks  Reading MD: Marca Ancona, MD  Order Authorizing Provider:  Josephina Gip, MD  Resting Radionuclide: Technetium 34m Sestamibi  Resting Radionuclide Dose: 11.0 mCi   Stress Radionuclide:  Technetium 4m Sestamibi  Stress Radionuclide Dose: 32.9 mCi           Stress Protocol Rest HR: 73 Stress HR: 86  Rest BP: 129/60 Stress BP: 131/59  Exercise Time (min): n/a METS: n/a   Predicted Max HR: 143 bpm % Max HR: 60.14 bpm Rate Pressure Product: 62952   Dose of Adenosine (mg):  n/a Dose of Lexiscan: 0.4 mg  Dose of Atropine (mg): n/a Dose of Dobutamine: n/a mcg/kg/min (at max HR)  Stress Test Technologist: Smiley Houseman, CMA-N  Nuclear Technologist:  Domenic Polite, CNMT     Rest Procedure:  Myocardial perfusion imaging was performed at rest 45 minutes following the intravenous administration of Technetium 59m Sestamibi.  Rest  ECG: Nonspecific T-wave changes with occasional PVC's.  Stress Procedure:  The patient received IV Lexiscan 0.4 mg over 15-seconds.  Technetium 6m Sestamibi was injected at 30-seconds.  There were no significant changes with Lexiscan, other than a hypotensive response to the Lexiscan.  Quantitative spect images were obtained after a 45 minute delay.  Stress ECG: No significant change from baseline ECG  QPS Raw Data Images:  Normal; no motion artifact; normal heart/lung ratio. Stress Images:  Small, mild apical perfusion defect.  Rest Images:  Small, mild apical perfusion defect, less marked than with stress.  Subtraction (SDS):  Small, mild partially reversible apical perfusion defect.  Transient Ischemic Dilatation (Normal <1.22):  1.44 Lung/Heart Ratio (Normal <0.45):  0.39  Quantitative Gated Spect Images QGS EDV:  100 ml QGS ESV:  46 ml  Impression Exercise Capacity:  Lexiscan with no exercise. BP Response:  Hypotensive blood pressure response. Clinical Symptoms:  Lightheadedness ECG Impression:  No significant ST segment change suggestive of ischemia. Comparison with Prior Nuclear Study: No previous nuclear study performed  Overall Impression:  Low risk stress nuclear study. Small, mild partially reversible apical perfusion defect may be due to apical thinning.  Cannot completely rule out small infarction with peri-infarct ischemia.  LV Ejection Fraction: 54%.  LV Wall Motion:  NL LV Function; NL Wall Motion  Marca Ancona 10/07/2012

## 2012-10-18 ENCOUNTER — Other Ambulatory Visit: Payer: Self-pay | Admitting: *Deleted

## 2012-10-18 ENCOUNTER — Encounter (HOSPITAL_COMMUNITY): Payer: Self-pay | Admitting: Pharmacy Technician

## 2012-10-18 DIAGNOSIS — Z01810 Encounter for preprocedural cardiovascular examination: Secondary | ICD-10-CM

## 2012-10-18 DIAGNOSIS — I739 Peripheral vascular disease, unspecified: Secondary | ICD-10-CM

## 2012-10-21 NOTE — Pre-Procedure Instructions (Signed)
20 [name]  10/21/2012   Your procedure is scheduled on:  Wednesday, November 20th  Report to [facility] at 0630 AM.  Call this number if you have problems the morning of surgery: [phone]   Remember:   Do not eat food or drink:After Midnight.   Take these medicines the morning of surgery with A SIP OF WATER: synthroid   Do not wear jewelry, make-up or nail polish.  Do not wear lotions, powders, or perfumes.  Do not shave 48 hours prior to surgery. Men may shave face and neck.  Do not bring valuables to the hospital.  Contacts, dentures or bridgework may not be worn into surgery.  Leave suitcase in the car. After surgery it may be brought to your room.  For patients admitted to the hospital, checkout time is 11:00 AM the day of discharge.   Patients discharged the day of surgery will not be allowed to drive home.   Special Instructions: Shower using CHG 2 nights before surgery and the night before surgery.  If you shower the day of surgery use CHG.  Use special wash - you have one bottle of CHG for all showers.  You should use approximately 1/3 of the bottle for each shower.   Please read over the following fact sheets that you were given: Pain Booklet, Coughing and Deep Breathing, Blood Transfusion Information, MRSA Information and Surgical Site Infection Prevention

## 2012-10-22 ENCOUNTER — Encounter (HOSPITAL_COMMUNITY)
Admission: RE | Admit: 2012-10-22 | Discharge: 2012-10-22 | Disposition: A | Discharge status: Home / Self Care | Payer: Medicare Other | Source: Ambulatory Visit | Attending: Anesthesiology | Admitting: Anesthesiology

## 2012-10-22 ENCOUNTER — Encounter (HOSPITAL_COMMUNITY): Payer: Self-pay

## 2012-10-22 ENCOUNTER — Encounter (HOSPITAL_COMMUNITY)
Admission: RE | Admit: 2012-10-22 | Discharge: 2012-10-22 | Disposition: A | Discharge status: Home / Self Care | Payer: Medicare Other | Source: Ambulatory Visit | Attending: Vascular Surgery | Admitting: Vascular Surgery

## 2012-10-22 ENCOUNTER — Encounter: Payer: Self-pay | Admitting: Vascular Surgery

## 2012-10-22 HISTORY — DX: Hypothyroidism, unspecified: E03.9

## 2012-10-22 LAB — COMPREHENSIVE METABOLIC PANEL
AST: 20 U/L (ref 0–37)
Albumin: 3.8 g/dL (ref 3.5–5.2)
Alkaline Phosphatase: 37 U/L — ABNORMAL LOW (ref 39–117)
Chloride: 99 mEq/L (ref 96–112)
Potassium: 5.8 mEq/L — ABNORMAL HIGH (ref 3.5–5.1)
Sodium: 135 mEq/L (ref 135–145)
Total Bilirubin: 0.2 mg/dL — ABNORMAL LOW (ref 0.3–1.2)
Total Protein: 7.4 g/dL (ref 6.0–8.3)

## 2012-10-22 LAB — CBC
MCHC: 33.1 g/dL (ref 30.0–36.0)
Platelets: 190 10*3/uL (ref 150–400)
RDW: 14.2 % (ref 11.5–15.5)
WBC: 6.8 10*3/uL (ref 4.0–10.5)

## 2012-10-22 LAB — APTT: aPTT: 36 seconds (ref 24–37)

## 2012-10-22 LAB — SURGICAL PCR SCREEN
MRSA, PCR: NEGATIVE
Staphylococcus aureus: NEGATIVE

## 2012-10-22 LAB — PROTIME-INR
INR: 0.96 (ref 0.00–1.49)
Prothrombin Time: 12.7 seconds (ref 11.6–15.2)

## 2012-10-22 NOTE — Progress Notes (Signed)
Patient incontinent of urine due to prostate surgery unable to provide urine sample for ua.  Will attempt to bring sample in morning of surgery.  Also requesting foley be placed upon arrival to short stay due to severe incontinence.

## 2012-10-22 NOTE — Progress Notes (Signed)
10/22/12 0852  OBSTRUCTIVE SLEEP APNEA  Have you ever been diagnosed with sleep apnea through a sleep study? No  Do you snore loudly (loud enough to be heard through closed doors)?  1  Do you often feel tired, fatigued, or sleepy during the daytime? 1  Has anyone observed you stop breathing during your sleep? 0  Do you have, or are you being treated for high blood pressure? 1  BMI more than 35 kg/m2? 0  Age over 76 years old? 1  Neck circumference greater than 40 cm/18 inches? 0  Gender: 1  Obstructive Sleep Apnea Score 5   Score 4 or greater  Results sent to PCP

## 2012-10-22 NOTE — Progress Notes (Signed)
Primary Physician - Dr. Sedalia Muta -   Cardiologist - Corinda Gubler - EKG, Stress Test - in epic

## 2012-10-25 ENCOUNTER — Ambulatory Visit (INDEPENDENT_AMBULATORY_CARE_PROVIDER_SITE_OTHER): Payer: Medicare Other | Admitting: Vascular Surgery

## 2012-10-25 ENCOUNTER — Encounter: Payer: Self-pay | Admitting: Vascular Surgery

## 2012-10-25 ENCOUNTER — Encounter (INDEPENDENT_AMBULATORY_CARE_PROVIDER_SITE_OTHER): Payer: Medicare Other | Admitting: *Deleted

## 2012-10-25 VITALS — BP 196/80 | HR 75 | Resp 18 | Ht 68.0 in | Wt 208.0 lb

## 2012-10-25 DIAGNOSIS — I70219 Atherosclerosis of native arteries of extremities with intermittent claudication, unspecified extremity: Secondary | ICD-10-CM | POA: Insufficient documentation

## 2012-10-25 DIAGNOSIS — Z01810 Encounter for preprocedural cardiovascular examination: Secondary | ICD-10-CM

## 2012-10-25 DIAGNOSIS — I739 Peripheral vascular disease, unspecified: Secondary | ICD-10-CM

## 2012-10-25 MED ORDER — FENTANYL CITRATE 0.05 MG/ML IJ SOLN
INTRAMUSCULAR | Status: AC
  Filled 2012-10-25: qty 2

## 2012-10-25 MED ORDER — MIDAZOLAM HCL 2 MG/2ML IJ SOLN
INTRAMUSCULAR | Status: AC
  Filled 2012-10-25: qty 2

## 2012-10-25 NOTE — Progress Notes (Signed)
Subjective:     Patient ID: [name], male   DOB: 1933-05-27, 78 y.o.   MRN: [id number]  HPI this 78 year old male returns today to discuss the findings of his angiogram performed a few weeks ago by Dr. Myra Gianotti. The patient had 2 nonhealing ulcers in the distal left pretibial area. He was found to have severe superficial femoral and popliteal occlusive disease with patent below knee popliteal artery and severe tibial disease. Patient had extensive external iliac common femoral and profunda femoral endarterectomy by me in the past. Patient's ulcers have currently healed. He is having severe claudication being unable to ambulate one quarter of a block. He has similar symptoms in the right leg. He had a Cardiolite performed a few weeks ago which revealed a good ejection fraction-54% and no evidence of ischemia.  Past Medical History  Diagnosis Date  . Hyperlipidemia   . Peripheral vascular disease   . Cancer 2011    Right kidney   . SOB (shortness of breath) on exertion   . Stroke     TIA  . Psoriasis   . Coronary artery disease   . COPD (chronic obstructive pulmonary disease)   . Asthma   . GERD (gastroesophageal reflux disease)   . Hypertension     takes meds daily  . Hypothyroidism     History  Substance Use Topics  . Smoking status: Current Every Day Smoker -- 1.5 packs/day    Types: Cigarettes  . Smokeless tobacco: Not on file  . Alcohol Use: No    No family history on file.  Allergies  Allergen Reactions  . Penicillins Rash    Current outpatient prescriptions:aspirin 325 MG tablet, Take 325 mg by mouth daily., Disp: , Rfl: ;  doxazosin (CARDURA) 2 MG tablet, Take 2 mg by mouth at bedtime., Disp: , Rfl: ;  fluocinonide ointment (LIDEX) 0.05 %, Apply topically 2 (two) times daily., Disp: , Rfl: ;  folic acid (FOLVITE) 400 MCG tablet, Take 400 mcg by mouth daily., Disp: , Rfl: ;  levothyroxine (SYNTHROID, LEVOTHROID) 75 MCG tablet, Take 75 mcg by mouth daily., Disp: , Rfl:   losartan (COZAAR) 100 MG tablet, Take 100 mg by mouth daily., Disp: , Rfl: ;  methotrexate (RHEUMATREX) 2.5 MG tablet, Take 12.5 mg by mouth once a week. Caution:Chemotherapy. Protect from light; Takes 5 tablets to get 12.5 mg dose, takes weekly on Monday., Disp: , Rfl: ;  pravastatin (PRAVACHOL) 40 MG tablet, Take 40 mg by mouth daily., Disp: , Rfl:   There were no vitals taken for this visit.  There is no height or weight on file to calculate BMI.           Review of Systems does have dyspnea on exertion, urinary incontinence from previous prostatectomy denies chest pain, hemoptysis, orthopnea.    Objective:   Physical Exam blood pressure 130/70 heart rate 70 respirations 16 Gen.-alert and oriented x3 in no apparent distress HEENT normal for age Lungs no rhonchi or wheezing Cardiovascular regular rhythm no murmurs carotid pulses 3+ palpable no bruits audible Abdomen soft nontender no palpable masses-obese  Musculoskeletal free of  major deformities Skin clear -no rashes Neurologic normal Lower extremities 3+ femoral pulses palpable bilaterally with no edema--no popliteal or distal pulses palpable bilaterally.  Today I ordered vein mapping of his left great saphenous vein. It is a nice compressible vein which appears to be of adequate caliber to below the knee.     Assessment:     Severe  left femoral popliteal and tibial occlusive disease with recent nonhealing ulcer now healed and severe claudication Discussed options with patient including observation versus surgical bypass and he would like to proceed with surgery at this time    Plan:     Plan left femoral-popliteal bypass graft with saphenous vein on Wednesday, November 20-risks and benefits thoroughly discussed with patient he would like to proceed The patient had Myoview performed a few weeks ago with EF 54% and no evidence of ischemia-low risk study

## 2012-10-25 NOTE — Consult Note (Addendum)
Anesthesia chart review: Patient is a 78 year old male scheduled for left femoral-popliteal bypass graft by Dr. Hart Rochester on 10/27/2012. History includes PAD s/p extensive EIA, CFA, profunda femoral endarterectomies bilaterally '08, smoking, HTN, HLD, TIA, psoriasis, COPD, asthma, GERD, obesity, hypothyroidism, CKD stage III (Dr. Lowell Guitar), right renal cancer status post nephrectomy '11, bilateral carotid endarterectomies (right '03, left '06), prostate cancer s/p prostatectomy.  I do not see a definitive diagnosis of CAD by multiple [facility] notes, although it is listed on his health history.  PCP is Dr. Danie Binder and Mady Haagensen.  Nuclear stress test on 10/07/12 showed: Low risk stress nuclear study. Small, mild partially reversible apical perfusion defect may be due to apical thinning. Cannot completely rule out small infarction with peri-infarct ischemia. LV Ejection Fraction: 54%. LV Wall Motion: NL LV Function; NL Wall Motion.  EKG from 10/05/2012 showed sinus rhythm with occasional PVCs, nonspecific T wave abnormality. Was not felt significantly changed from his EKG on 03/09/2007.  CXR on 10/22/12 showed: Cardiomediastinal silhouette is stable. No acute  infiltrate or pleural effusion. No pulmonary edema. Bony thorax is unremarkable. Mild hyperinflation again noted.  Labs noted.  K+ 5.8, BUN/Cr 40/2.07.  (Labs from October 2013 show a Cr of 1.90-2.10, BUN 31-38, with K+ ~ 4.9, so renal function appears stable.  As above, he has only a left kidney.)  He has difficultly voiding since his prostate surgery and could not give a urine specimen at his PAT appointment, so he plans to bring one in on the day of surgery.  He is requesting a foley catheter.  Plan BMET on arrival to re-evaluate his renal function and hyperkalemia.  I reviewed his history and stress test results with Anesthesiologist Dr. Katrinka Blazing.  Based on stress test results, would plan to proceed if no acute CV symptoms.  I'll request records,  including labs, from [facility] and [facility] and review when available.  I'll update my note if any acute changes noted in his renal function noted.  If K+ reasonable and Cr stable then anticipate he can proceed.  Shonna Chock, PA-C 10/25/12 1336

## 2012-10-26 MED ORDER — VANCOMYCIN HCL IN DEXTROSE 1-5 GM/200ML-% IV SOLN
1000.0000 mg | INTRAVENOUS | Status: AC
  Administered 2012-10-27: 1000 mg via INTRAVENOUS
  Filled 2012-10-26: qty 200

## 2012-10-26 NOTE — Progress Notes (Signed)
Notified patient of new arrival time of 07:30

## 2012-10-27 ENCOUNTER — Inpatient Hospital Stay (HOSPITAL_COMMUNITY): Payer: Medicare Other | Admitting: Vascular Surgery

## 2012-10-27 ENCOUNTER — Encounter (HOSPITAL_COMMUNITY): Payer: Self-pay | Admitting: Vascular Surgery

## 2012-10-27 ENCOUNTER — Encounter (HOSPITAL_COMMUNITY): Payer: Self-pay | Admitting: *Deleted

## 2012-10-27 ENCOUNTER — Encounter (HOSPITAL_COMMUNITY)
Admission: RE | Disposition: A | Discharge status: Home / Self Care | Payer: Self-pay | Source: Ambulatory Visit | Attending: Vascular Surgery

## 2012-10-27 ENCOUNTER — Inpatient Hospital Stay (HOSPITAL_COMMUNITY): Payer: Medicare Other

## 2012-10-27 ENCOUNTER — Inpatient Hospital Stay (HOSPITAL_COMMUNITY)
Admission: RE | Admit: 2012-10-27 | Discharge: 2012-10-29 | DRG: 254 | Disposition: A | Discharge status: Home / Self Care | Payer: Medicare Other | Source: Ambulatory Visit | Attending: Vascular Surgery | Admitting: Vascular Surgery

## 2012-10-27 DIAGNOSIS — I739 Peripheral vascular disease, unspecified: Principal | ICD-10-CM | POA: Diagnosis present

## 2012-10-27 DIAGNOSIS — E039 Hypothyroidism, unspecified: Secondary | ICD-10-CM | POA: Diagnosis present

## 2012-10-27 DIAGNOSIS — I70219 Atherosclerosis of native arteries of extremities with intermittent claudication, unspecified extremity: Secondary | ICD-10-CM

## 2012-10-27 DIAGNOSIS — L98499 Non-pressure chronic ulcer of skin of other sites with unspecified severity: Principal | ICD-10-CM | POA: Diagnosis present

## 2012-10-27 DIAGNOSIS — I10 Essential (primary) hypertension: Secondary | ICD-10-CM | POA: Diagnosis present

## 2012-10-27 DIAGNOSIS — Z79899 Other long term (current) drug therapy: Secondary | ICD-10-CM

## 2012-10-27 DIAGNOSIS — K219 Gastro-esophageal reflux disease without esophagitis: Secondary | ICD-10-CM | POA: Diagnosis present

## 2012-10-27 DIAGNOSIS — E785 Hyperlipidemia, unspecified: Secondary | ICD-10-CM | POA: Diagnosis present

## 2012-10-27 DIAGNOSIS — I959 Hypotension, unspecified: Secondary | ICD-10-CM | POA: Diagnosis present

## 2012-10-27 DIAGNOSIS — Z85528 Personal history of other malignant neoplasm of kidney: Secondary | ICD-10-CM

## 2012-10-27 DIAGNOSIS — F17200 Nicotine dependence, unspecified, uncomplicated: Secondary | ICD-10-CM | POA: Diagnosis present

## 2012-10-27 DIAGNOSIS — L97809 Non-pressure chronic ulcer of other part of unspecified lower leg with unspecified severity: Secondary | ICD-10-CM | POA: Diagnosis present

## 2012-10-27 DIAGNOSIS — I2510 Atherosclerotic heart disease of native coronary artery without angina pectoris: Secondary | ICD-10-CM | POA: Diagnosis present

## 2012-10-27 DIAGNOSIS — J4489 Other specified chronic obstructive pulmonary disease: Secondary | ICD-10-CM | POA: Diagnosis present

## 2012-10-27 DIAGNOSIS — J449 Chronic obstructive pulmonary disease, unspecified: Secondary | ICD-10-CM | POA: Diagnosis present

## 2012-10-27 DIAGNOSIS — Z7982 Long term (current) use of aspirin: Secondary | ICD-10-CM

## 2012-10-27 DIAGNOSIS — E875 Hyperkalemia: Secondary | ICD-10-CM | POA: Diagnosis present

## 2012-10-27 HISTORY — PX: FEMORAL-POPLITEAL BYPASS GRAFT: SHX937

## 2012-10-27 LAB — BASIC METABOLIC PANEL
CO2: 27 mEq/L (ref 19–32)
Calcium: 9.9 mg/dL (ref 8.4–10.5)
Chloride: 101 mEq/L (ref 96–112)
Creatinine, Ser: 1.93 mg/dL — ABNORMAL HIGH (ref 0.50–1.35)
GFR calc Af Amer: 37 mL/min — ABNORMAL LOW (ref >90)
GFR calc Af Amer: 35 mL/min — ABNORMAL LOW (ref >90)
GFR calc non Af Amer: 30 mL/min — ABNORMAL LOW (ref >90)
Potassium: 4.8 mEq/L (ref 3.5–5.1)
Sodium: 136 mEq/L (ref 135–145)
Sodium: 135 mEq/L (ref 135–145)

## 2012-10-27 LAB — URINALYSIS, ROUTINE W REFLEX MICROSCOPIC
Glucose, UA: NEGATIVE mg/dL
Ketones, ur: NEGATIVE mg/dL
Leukocytes, UA: NEGATIVE
Nitrite: POSITIVE — AB
Protein, ur: NEGATIVE mg/dL

## 2012-10-27 LAB — URINE MICROSCOPIC-ADD ON

## 2012-10-27 LAB — CBC
Hemoglobin: 11.1 g/dL — ABNORMAL LOW (ref 13.0–17.0)
RBC: 3.46 MIL/uL — ABNORMAL LOW (ref 4.22–5.81)

## 2012-10-27 SURGERY — BYPASS GRAFT FEMORAL-POPLITEAL ARTERY
Anesthesia: General | Site: Leg Upper | Laterality: Left | Wound class: Clean

## 2012-10-27 MED ORDER — OXYCODONE HCL 5 MG PO TABS
5.0000 mg | ORAL_TABLET | ORAL | Status: DC | PRN
  Administered 2012-10-28: 10 mg via ORAL
  Filled 2012-10-27: qty 2

## 2012-10-27 MED ORDER — NAPROXEN 250 MG PO TABS
250.0000 mg | ORAL_TABLET | Freq: Every evening | ORAL | Status: DC | PRN
  Filled 2012-10-27: qty 1

## 2012-10-27 MED ORDER — VECURONIUM BROMIDE 10 MG IV SOLR
INTRAVENOUS | Status: DC | PRN
  Administered 2012-10-27 (×3): 2 mg via INTRAVENOUS

## 2012-10-27 MED ORDER — VANCOMYCIN HCL IN DEXTROSE 1-5 GM/200ML-% IV SOLN
1000.0000 mg | INTRAVENOUS | Status: AC
  Administered 2012-10-28: 1000 mg via INTRAVENOUS
  Filled 2012-10-27: qty 200

## 2012-10-27 MED ORDER — LACTATED RINGERS IV SOLN
INTRAVENOUS | Status: DC | PRN
  Administered 2012-10-27 (×2): via INTRAVENOUS

## 2012-10-27 MED ORDER — NEOSTIGMINE METHYLSULFATE 1 MG/ML IJ SOLN
INTRAMUSCULAR | Status: DC | PRN
  Administered 2012-10-27: 5 mg via INTRAVENOUS

## 2012-10-27 MED ORDER — ARTIFICIAL TEARS OP OINT
TOPICAL_OINTMENT | OPHTHALMIC | Status: DC | PRN
  Administered 2012-10-27: 1 via OPHTHALMIC

## 2012-10-27 MED ORDER — FENTANYL CITRATE 0.05 MG/ML IJ SOLN
INTRAMUSCULAR | Status: DC | PRN
  Administered 2012-10-27 (×3): 50 ug via INTRAVENOUS
  Administered 2012-10-27 (×2): 25 ug via INTRAVENOUS

## 2012-10-27 MED ORDER — 0.9 % SODIUM CHLORIDE (POUR BTL) OPTIME
TOPICAL | Status: DC | PRN
  Administered 2012-10-27: 2000 mL

## 2012-10-27 MED ORDER — ROCURONIUM BROMIDE 100 MG/10ML IV SOLN
INTRAVENOUS | Status: DC | PRN
  Administered 2012-10-27: 50 mg via INTRAVENOUS

## 2012-10-27 MED ORDER — OXYCODONE HCL 5 MG/5ML PO SOLN: 5.0000 mg | Freq: Once | ORAL | Status: DC | PRN

## 2012-10-27 MED ORDER — MORPHINE SULFATE 2 MG/ML IJ SOLN: 2.0000 mg | INTRAMUSCULAR | Status: DC | PRN

## 2012-10-27 MED ORDER — FOLIC ACID 0.5 MG HALF TAB
0.5000 mg | ORAL_TABLET | Freq: Every day | ORAL | Status: DC
  Administered 2012-10-27 – 2012-10-29 (×3): 0.5 mg via ORAL
  Filled 2012-10-27 (×3): qty 1

## 2012-10-27 MED ORDER — OXYCODONE HCL 5 MG PO TABS: 5.0000 mg | ORAL_TABLET | Freq: Once | ORAL | Status: DC | PRN

## 2012-10-27 MED ORDER — LEVOTHYROXINE SODIUM 75 MCG PO TABS
75.0000 ug | ORAL_TABLET | Freq: Every day | ORAL | Status: DC
  Administered 2012-10-28 – 2012-10-29 (×2): 75 ug via ORAL
  Filled 2012-10-27 (×3): qty 1

## 2012-10-27 MED ORDER — PANTOPRAZOLE SODIUM 40 MG PO TBEC
40.0000 mg | DELAYED_RELEASE_TABLET | Freq: Every day | ORAL | Status: DC
  Administered 2012-10-27 – 2012-10-29 (×3): 40 mg via ORAL
  Filled 2012-10-27 (×3): qty 1

## 2012-10-27 MED ORDER — PROTAMINE SULFATE 10 MG/ML IV SOLN
INTRAVENOUS | Status: DC | PRN
  Administered 2012-10-27 (×5): 10 mg via INTRAVENOUS

## 2012-10-27 MED ORDER — PROPOFOL 10 MG/ML IV BOLUS
INTRAVENOUS | Status: DC | PRN
  Administered 2012-10-27: 200 mg via INTRAVENOUS

## 2012-10-27 MED ORDER — SODIUM CHLORIDE 0.9 % IV SOLN: INTRAVENOUS | Status: DC

## 2012-10-27 MED ORDER — MIDAZOLAM HCL 2 MG/2ML IJ SOLN: 1.0000 mg | INTRAMUSCULAR | Status: DC | PRN

## 2012-10-27 MED ORDER — FLUOCINONIDE 0.05 % EX OINT
TOPICAL_OINTMENT | Freq: Two times a day (BID) | CUTANEOUS | Status: DC
  Filled 2012-10-27: qty 15

## 2012-10-27 MED ORDER — SODIUM CHLORIDE 0.9 % IR SOLN
Status: DC | PRN
  Administered 2012-10-27: 12:00:00

## 2012-10-27 MED ORDER — VANCOMYCIN HCL IN DEXTROSE 1-5 GM/200ML-% IV SOLN
1000.0000 mg | Freq: Two times a day (BID) | INTRAVENOUS | Status: DC
  Filled 2012-10-27: qty 200

## 2012-10-27 MED ORDER — DOPAMINE-DEXTROSE 3.2-5 MG/ML-% IV SOLN
2.0000 ug/kg/min | INTRAVENOUS | Status: AC
  Administered 2012-10-27: 5 ug/kg/min via INTRAVENOUS
  Filled 2012-10-27: qty 250

## 2012-10-27 MED ORDER — IOHEXOL 300 MG/ML  SOLN
INTRAMUSCULAR | Status: DC | PRN
  Administered 2012-10-27: 30 mL via INTRA_ARTERIAL

## 2012-10-27 MED ORDER — ONDANSETRON HCL 4 MG/2ML IJ SOLN: 4.0000 mg | Freq: Four times a day (QID) | INTRAMUSCULAR | Status: DC | PRN

## 2012-10-27 MED ORDER — PHENOL 1.4 % MT LIQD: 1.0000 | OROMUCOSAL | Status: DC | PRN

## 2012-10-27 MED ORDER — PHENYLEPHRINE HCL 10 MG/ML IJ SOLN
INTRAMUSCULAR | Status: DC | PRN
  Administered 2012-10-27: 40 ug via INTRAVENOUS
  Administered 2012-10-27: 80 ug via INTRAVENOUS
  Administered 2012-10-27 (×2): 40 ug via INTRAVENOUS

## 2012-10-27 MED ORDER — LABETALOL HCL 5 MG/ML IV SOLN: 10.0000 mg | INTRAVENOUS | Status: DC | PRN

## 2012-10-27 MED ORDER — METHOTREXATE 2.5 MG PO TABS: 12.5000 mg | ORAL_TABLET | ORAL | Status: DC

## 2012-10-27 MED ORDER — METOPROLOL TARTRATE 1 MG/ML IV SOLN: 2.0000 mg | INTRAVENOUS | Status: DC | PRN

## 2012-10-27 MED ORDER — HEPARIN SODIUM (PORCINE) 1000 UNIT/ML IJ SOLN
INTRAMUSCULAR | Status: DC | PRN
  Administered 2012-10-27: 6000 [IU] via INTRAVENOUS

## 2012-10-27 MED ORDER — SODIUM CHLORIDE 0.9 % IV SOLN
10.0000 mg | INTRAVENOUS | Status: DC | PRN
  Administered 2012-10-27: 10 ug/min via INTRAVENOUS

## 2012-10-27 MED ORDER — HYDROMORPHONE HCL PF 1 MG/ML IJ SOLN: 0.2500 mg | INTRAMUSCULAR | Status: DC | PRN

## 2012-10-27 MED ORDER — DOCUSATE SODIUM 100 MG PO CAPS
100.0000 mg | ORAL_CAPSULE | Freq: Every day | ORAL | Status: DC
  Administered 2012-10-28 – 2012-10-29 (×2): 100 mg via ORAL
  Filled 2012-10-27 (×2): qty 1

## 2012-10-27 MED ORDER — ALBUTEROL SULFATE HFA 108 (90 BASE) MCG/ACT IN AERS
INHALATION_SPRAY | RESPIRATORY_TRACT | Status: DC | PRN
  Administered 2012-10-27: 2 via RESPIRATORY_TRACT

## 2012-10-27 MED ORDER — SIMVASTATIN 5 MG PO TABS
5.0000 mg | ORAL_TABLET | Freq: Every day | ORAL | Status: DC
  Administered 2012-10-27 – 2012-10-28 (×2): 5 mg via ORAL
  Filled 2012-10-27 (×3): qty 1

## 2012-10-27 MED ORDER — NAPROXEN SODIUM 220 MG PO TABS: 220.0000 mg | ORAL_TABLET | Freq: Every evening | ORAL | Status: DC | PRN

## 2012-10-27 MED ORDER — ONDANSETRON HCL 4 MG/2ML IJ SOLN
INTRAMUSCULAR | Status: DC | PRN
  Administered 2012-10-27: 4 mg via INTRAVENOUS

## 2012-10-27 MED ORDER — SODIUM CHLORIDE 0.9 % IV SOLN
INTRAVENOUS | Status: DC
  Administered 2012-10-27: 16:00:00 via INTRAVENOUS

## 2012-10-27 MED ORDER — DOPAMINE-DEXTROSE 3.2-5 MG/ML-% IV SOLN
2.0000 ug/kg/min | INTRAVENOUS | Status: DC
  Administered 2012-10-27: 10 ug/kg/min via INTRAVENOUS
  Administered 2012-10-28: 7.5 ug/kg/min via INTRAVENOUS
  Filled 2012-10-27: qty 250

## 2012-10-27 MED ORDER — GLYCOPYRROLATE 0.2 MG/ML IJ SOLN
INTRAMUSCULAR | Status: DC | PRN
  Administered 2012-10-27: .8 mg via INTRAVENOUS

## 2012-10-27 MED ORDER — FENTANYL CITRATE 0.05 MG/ML IJ SOLN: 50.0000 ug | Freq: Once | INTRAMUSCULAR | Status: DC

## 2012-10-27 MED ORDER — ASPIRIN 325 MG PO TABS
325.0000 mg | ORAL_TABLET | Freq: Every day | ORAL | Status: DC
  Administered 2012-10-27 – 2012-10-29 (×3): 325 mg via ORAL
  Filled 2012-10-27 (×3): qty 1

## 2012-10-27 MED ORDER — ACETAMINOPHEN 325 MG PO TABS: 325.0000 mg | ORAL_TABLET | ORAL | Status: DC | PRN

## 2012-10-27 MED ORDER — ALBUMIN HUMAN 5 % IV SOLN
INTRAVENOUS | Status: DC | PRN
  Administered 2012-10-27 (×2): via INTRAVENOUS

## 2012-10-27 MED ORDER — SODIUM CHLORIDE 0.9 % IV SOLN
500.0000 mL | Freq: Once | INTRAVENOUS | Status: AC | PRN
  Administered 2012-10-27: 500 mL via INTRAVENOUS

## 2012-10-27 MED ORDER — POTASSIUM CHLORIDE CRYS ER 20 MEQ PO TBCR: 20.0000 meq | EXTENDED_RELEASE_TABLET | Freq: Once | ORAL | Status: AC | PRN

## 2012-10-27 MED ORDER — LACTATED RINGERS IV SOLN
INTRAVENOUS | Status: DC
  Administered 2012-10-27: 09:00:00 via INTRAVENOUS

## 2012-10-27 MED ORDER — HYDROMORPHONE HCL PF 1 MG/ML IJ SOLN
INTRAMUSCULAR | Status: AC
  Filled 2012-10-27: qty 1

## 2012-10-27 MED ORDER — GUAIFENESIN-DM 100-10 MG/5ML PO SYRP: 15.0000 mL | ORAL_SOLUTION | ORAL | Status: DC | PRN

## 2012-10-27 MED ORDER — ALUM & MAG HYDROXIDE-SIMETH 200-200-20 MG/5ML PO SUSP: 15.0000 mL | ORAL | Status: DC | PRN

## 2012-10-27 MED ORDER — LIDOCAINE HCL (CARDIAC) 20 MG/ML IV SOLN
INTRAVENOUS | Status: DC | PRN
  Administered 2012-10-27: 70 mg via INTRAVENOUS

## 2012-10-27 MED ORDER — ACETAMINOPHEN 650 MG RE SUPP: 325.0000 mg | RECTAL | Status: DC | PRN

## 2012-10-27 MED ORDER — FOLIC ACID 400 MCG PO TABS: 400.0000 ug | ORAL_TABLET | Freq: Every day | ORAL | Status: DC

## 2012-10-27 MED ORDER — PROMETHAZINE HCL 25 MG/ML IJ SOLN
6.2500 mg | INTRAMUSCULAR | Status: DC | PRN
Start: 2012-10-27 — End: 2012-10-27

## 2012-10-27 MED ORDER — DOPAMINE-DEXTROSE 3.2-5 MG/ML-% IV SOLN
INTRAVENOUS | Status: DC | PRN
  Administered 2012-10-27: 5 ug/kg/min via INTRAVENOUS

## 2012-10-27 MED ORDER — ALBUMIN HUMAN 5 % IV SOLN
INTRAVENOUS | Status: AC
  Administered 2012-10-27: 12.5 g
  Filled 2012-10-27: qty 250

## 2012-10-27 MED ORDER — MAGNESIUM SULFATE 40 MG/ML IJ SOLN
2.0000 g | Freq: Once | INTRAMUSCULAR | Status: AC | PRN
  Filled 2012-10-27: qty 50

## 2012-10-27 MED ORDER — DOXAZOSIN MESYLATE 2 MG PO TABS
2.0000 mg | ORAL_TABLET | Freq: Every day | ORAL | Status: DC
  Administered 2012-10-27 – 2012-10-28 (×2): 2 mg via ORAL
  Filled 2012-10-27 (×3): qty 1

## 2012-10-27 MED ORDER — HYDRALAZINE HCL 20 MG/ML IJ SOLN: 10.0000 mg | INTRAMUSCULAR | Status: DC | PRN

## 2012-10-27 SURGICAL SUPPLY — 58 items
BANDAGE ESMARK 6X9 LF (GAUZE/BANDAGES/DRESSINGS) IMPLANT
BNDG ESMARK 6X9 LF (GAUZE/BANDAGES/DRESSINGS)
BOOT SUTURE AID YELLOW STND (SUTURE) IMPLANT
CANISTER SUCTION 2500CC (MISCELLANEOUS) ×2 IMPLANT
CLIP TI MEDIUM 24 (CLIP) ×2 IMPLANT
CLIP TI WIDE RED SMALL 24 (CLIP) ×4 IMPLANT
CLOTH BEACON ORANGE TIMEOUT ST (SAFETY) ×2 IMPLANT
COVER SURGICAL LIGHT HANDLE (MISCELLANEOUS) ×2 IMPLANT
DECANTER SPIKE VIAL GLASS SM (MISCELLANEOUS) IMPLANT
DERMABOND ADVANCED (GAUZE/BANDAGES/DRESSINGS) ×3
DERMABOND ADVANCED .7 DNX12 (GAUZE/BANDAGES/DRESSINGS) ×3 IMPLANT
DRAIN SNY 10X20 3/4 PERF (WOUND CARE) IMPLANT
DRAPE WARM FLUID 44X44 (DRAPE) ×2 IMPLANT
DRAPE X-RAY CASS 24X20 (DRAPES) ×2 IMPLANT
ELECT REM PT RETURN 9FT ADLT (ELECTROSURGICAL) ×2
ELECTRODE REM PT RTRN 9FT ADLT (ELECTROSURGICAL) ×1 IMPLANT
EVACUATOR SILICONE 100CC (DRAIN) IMPLANT
GLOVE BIO SURGEON STRL SZ 6.5 (GLOVE) ×2 IMPLANT
GLOVE BIOGEL PI IND STRL 6.5 (GLOVE) ×2 IMPLANT
GLOVE BIOGEL PI IND STRL 7.5 (GLOVE) ×1 IMPLANT
GLOVE BIOGEL PI INDICATOR 6.5 (GLOVE) ×2
GLOVE BIOGEL PI INDICATOR 7.5 (GLOVE) ×1
GLOVE SS BIOGEL STRL SZ 7 (GLOVE) ×1 IMPLANT
GLOVE SUPERSENSE BIOGEL SZ 7 (GLOVE) ×1
GLOVE SURG SS PI 6.5 STRL IVOR (GLOVE) ×2 IMPLANT
GOWN PREVENTION PLUS XLARGE (GOWN DISPOSABLE) ×2 IMPLANT
GOWN STRL NON-REIN LRG LVL3 (GOWN DISPOSABLE) ×6 IMPLANT
INSERT FOGARTY SM (MISCELLANEOUS) ×2 IMPLANT
KIT BASIN OR (CUSTOM PROCEDURE TRAY) ×2 IMPLANT
KIT ROOM TURNOVER OR (KITS) ×2 IMPLANT
NS IRRIG 1000ML POUR BTL (IV SOLUTION) ×4 IMPLANT
OMNIPAQUE 300MG/ ML  (50ML) ×2 IMPLANT
PACK PERIPHERAL VASCULAR (CUSTOM PROCEDURE TRAY) ×2 IMPLANT
PAD ARMBOARD 7.5X6 YLW CONV (MISCELLANEOUS) ×4 IMPLANT
PADDING CAST COTTON 6X4 STRL (CAST SUPPLIES) IMPLANT
SET COLLECT BLD 21X3/4 12 (NEEDLE) ×2 IMPLANT
STOPCOCK 4 WAY LG BORE MALE ST (IV SETS) ×2 IMPLANT
SUT PROLENE 6 0 BV (SUTURE) IMPLANT
SUT PROLENE 6 0 C 1 24 (SUTURE) ×2 IMPLANT
SUT PROLENE 6 0 CC (SUTURE) ×4 IMPLANT
SUT PROLENE 7 0 BV 1 (SUTURE) IMPLANT
SUT PROLENE 7 0 BV1 MDA (SUTURE) IMPLANT
SUT SILK 2 0 SH (SUTURE) ×2 IMPLANT
SUT SILK 3 0 (SUTURE)
SUT SILK 3-0 18XBRD TIE 12 (SUTURE) IMPLANT
SUT SILK 4 0 (SUTURE) ×2
SUT SILK 4-0 18XBRD TIE 12 (SUTURE) ×2 IMPLANT
SUT VIC AB 2-0 CTX 36 (SUTURE) ×4 IMPLANT
SUT VIC AB 3-0 SH 27 (SUTURE) ×4
SUT VIC AB 3-0 SH 27X BRD (SUTURE) ×4 IMPLANT
SUT VICRYL 4-0 PS2 18IN ABS (SUTURE) ×2 IMPLANT
TOWEL OR 17X24 6PK STRL BLUE (TOWEL DISPOSABLE) ×4 IMPLANT
TOWEL OR 17X26 10 PK STRL BLUE (TOWEL DISPOSABLE) ×4 IMPLANT
TRAY FOLEY CATH 14FRSI W/METER (CATHETERS) IMPLANT
TUBE CONNECTING 12X1/4 (SUCTIONS) ×2 IMPLANT
TUBING EXTENTION W/L.L. (IV SETS) ×2 IMPLANT
UNDERPAD 30X30 INCONTINENT (UNDERPADS AND DIAPERS) ×2 IMPLANT
WATER STERILE IRR 1000ML POUR (IV SOLUTION) ×2 IMPLANT

## 2012-10-27 NOTE — Anesthesia Postprocedure Evaluation (Signed)
  Anesthesia Post-op Note  Patient: [name]  Procedure(s) Performed: Procedure(s) (LRB) with comments: BYPASS GRAFT FEMORAL-POPLITEAL ARTERY (Left) - Vein graft  Patient Location: PACU  Anesthesia Type:General  Level of Consciousness: awake  Airway and Oxygen Therapy: Patient Spontanous Breathing  Post-op Pain: mild  Post-op Assessment: Post-op Vital signs reviewed, Patient's Cardiovascular Status Stable, Respiratory Function Stable, Patent Airway, No signs of Nausea or vomiting and Pain level controlled  Post-op Vital Signs: stable  Complications: No apparent anesthesia complications

## 2012-10-27 NOTE — Progress Notes (Signed)
ANTIBIOTIC CONSULT NOTE - INITIAL  Pharmacy Consult to adjust antibiotics for renal function Indication: post op Bypass graft femoral-popliteal artery  Allergies  Allergen Reactions  . Penicillins Rash    Patient Measurements: Height: 5' 8.11" (173 cm) Weight: 207 lb 14.3 oz (94.3 kg) IBW/kg (Calculated) : 68.65    Vital Signs: Temp: 96.8 F (36 C) (11/20 1630) Temp src: Oral (11/20 0753) BP: 106/62 mmHg (11/20 1630) Pulse Rate: 73  (11/20 1500) Intake/Output from previous day:   Intake/Output from this shift: Total I/O In: 2050 [I.V.:1800; IV Piggyback:250] Out: 390 [Urine:290; Blood:100]  Labs:  [facility] 10/27/12 1530 10/27/12 0747  WBC 7.7 --  HGB 11.1* --  PLT 132* --  LABCREA -- --  CREATININE 2.03* 1.93*   Estimated Creatinine Clearance: 34 ml/min (by C-G formula based on Cr of 2.03). No results found for this basename: VANCOTROUGH:2,VANCOPEAK:2,VANCORANDOM:2,GENTTROUGH:2,GENTPEAK:2,GENTRANDOM:2,TOBRATROUGH:2,TOBRAPEAK:2,TOBRARND:2,AMIKACINPEAK:2,AMIKACINTROU:2,AMIKACIN:2, in the last 72 hours   Microbiology: Recent Results (from the past 720 hour(s))  SURGICAL PCR SCREEN     Status: Normal   Collection Time   10/22/12  8:59 AM      Component Value Range Status Comment   MRSA, PCR NEGATIVE  NEGATIVE Final    Staphylococcus aureus NEGATIVE  NEGATIVE Final     Medical History: Past Medical History  Diagnosis Date  . Hyperlipidemia   . Peripheral vascular disease   . Cancer 2011    Right kidney   . SOB (shortness of breath) on exertion   . Stroke     TIA  . Psoriasis   . Coronary artery disease   . COPD (chronic obstructive pulmonary disease)   . Asthma   . GERD (gastroesophageal reflux disease)   . Hypertension     takes meds daily  . Hypothyroidism     Medications:  Scheduled:    . [COMPLETED] albumin human      . aspirin  325 mg Oral Daily  . docusate sodium  100 mg Oral Daily  . [COMPLETED] DOPamine  2-20 mcg/kg/min Intravenous To OR   . doxazosin  2 mg Oral QHS  . fluocinonide ointment   Topical BID  . HYDROmorphone      . levothyroxine  75 mcg Oral QAC breakfast  . methotrexate  12.5 mg Oral Weekly  . pantoprazole  40 mg Oral Daily  . simvastatin  5 mg Oral q1800  . [COMPLETED] vancomycin  1,000 mg Intravenous 120 min pre-op  . vancomycin  1,000 mg Intravenous Q12H  . [DISCONTINUED] fentaNYL  50-100 mcg Intravenous Once  . [DISCONTINUED] folic acid  400 mcg Oral Daily   Assessment: 78 yr old male on vancomycin post op femoral-popliteal artery bypass. Scr=2.03, so patients Crcl~73ml/min.   Goal of Therapy:  Prevent infection  Plan:  Change Vancomycin to 1000 mg IV q24hrs x 1 dose at 11 am tomorrow morning.     Wendie Simmer, PharmD, BCPS Clinical Pharmacist  Pager: [phone]

## 2012-10-27 NOTE — H&P (View-Only) (Signed)
Subjective:     Patient ID: [name], male   DOB: 09/13/1933, 78 y.o.   MRN: [id number]  HPI this 78-year-old male returns today to discuss the findings of his angiogram performed a few weeks ago by Dr. Brabham. The patient had 2 nonhealing ulcers in the distal left pretibial area. He was found to have severe superficial femoral and popliteal occlusive disease with patent below knee popliteal artery and severe tibial disease. Patient had extensive external iliac common femoral and profunda femoral endarterectomy by me in the past. Patient's ulcers have currently healed. He is having severe claudication being unable to ambulate one quarter of a block. He has similar symptoms in the right leg. He had a Cardiolite performed a few weeks ago which revealed a good ejection fraction-54% and no evidence of ischemia.  Past Medical History  Diagnosis Date  . Hyperlipidemia   . Peripheral vascular disease   . Cancer 2011    Right kidney   . SOB (shortness of breath) on exertion   . Stroke     TIA  . Psoriasis   . Coronary artery disease   . COPD (chronic obstructive pulmonary disease)   . Asthma   . GERD (gastroesophageal reflux disease)   . Hypertension     takes meds daily  . Hypothyroidism     History  Substance Use Topics  . Smoking status: Current Every Day Smoker -- 1.5 packs/day    Types: Cigarettes  . Smokeless tobacco: Not on file  . Alcohol Use: No    No family history on file.  Allergies  Allergen Reactions  . Penicillins Rash    Current outpatient prescriptions:aspirin 325 MG tablet, Take 325 mg by mouth daily., Disp: , Rfl: ;  doxazosin (CARDURA) 2 MG tablet, Take 2 mg by mouth at bedtime., Disp: , Rfl: ;  fluocinonide ointment (LIDEX) 0.05 %, Apply topically 2 (two) times daily., Disp: , Rfl: ;  folic acid (FOLVITE) 400 MCG tablet, Take 400 mcg by mouth daily., Disp: , Rfl: ;  levothyroxine (SYNTHROID, LEVOTHROID) 75 MCG tablet, Take 75 mcg by mouth daily., Disp: , Rfl:   losartan (COZAAR) 100 MG tablet, Take 100 mg by mouth daily., Disp: , Rfl: ;  methotrexate (RHEUMATREX) 2.5 MG tablet, Take 12.5 mg by mouth once a week. Caution:Chemotherapy. Protect from light; Takes 5 tablets to get 12.5 mg dose, takes weekly on Monday., Disp: , Rfl: ;  pravastatin (PRAVACHOL) 40 MG tablet, Take 40 mg by mouth daily., Disp: , Rfl:   There were no vitals taken for this visit.  There is no height or weight on file to calculate BMI.           Review of Systems does have dyspnea on exertion, urinary incontinence from previous prostatectomy denies chest pain, hemoptysis, orthopnea.    Objective:   Physical Exam blood pressure 130/70 heart rate 70 respirations 16 Gen.-alert and oriented x3 in no apparent distress HEENT normal for age Lungs no rhonchi or wheezing Cardiovascular regular rhythm no murmurs carotid pulses 3+ palpable no bruits audible Abdomen soft nontender no palpable masses-obese  Musculoskeletal free of  major deformities Skin clear -no rashes Neurologic normal Lower extremities 3+ femoral pulses palpable bilaterally with no edema--no popliteal or distal pulses palpable bilaterally.  Today I ordered vein mapping of his left great saphenous vein. It is a nice compressible vein which appears to be of adequate caliber to below the knee.     Assessment:     Severe   left femoral popliteal and tibial occlusive disease with recent nonhealing ulcer now healed and severe claudication Discussed options with patient including observation versus surgical bypass and he would like to proceed with surgery at this time    Plan:     Plan left femoral-popliteal bypass graft with saphenous vein on Wednesday, November 20-risks and benefits thoroughly discussed with patient he would like to proceed The patient had Myoview performed a few weeks ago with EF 54% and no evidence of ischemia-low risk study      

## 2012-10-27 NOTE — Preoperative (Signed)
Beta Blockers   Reason not to administer Beta Blockers:Not Applicable 

## 2012-10-27 NOTE — Op Note (Signed)
OPERATIVE REPORT  Date of Surgery: 10/27/2012  Surgeon: Josephina Gip, MD  Assistant: Della Goo  Pre-op Diagnosis: Left superficial femoral, popliteal, and tibial occlusive disease with severe claudication and recent nonhealing ulcer  Post-op Diagnosis: Same Procedure: Procedure(s): Left common femoral to popliteal (below-knee) bypass using a nonreversible and is located saphenous vein graft from left leg intraoperative arteriogram Anesthesia: General  EBL: 200 cc  Complications: None  Procedure Details: Patient was taken to the operating room placed in supine position at which time satisfactory general endotracheal anesthesia was administered. The left leg was prepped with Betadine scrub and solution and draped in routine sterile manner. Incision was made in the inguinal area through the previous scar carried down to subcutaneous tissue. The left common femoral artery which had previously been treated with endarterectomy and patch angioplasty was dissected free. A good pulse anteriorly. Saphenous vein was exposed at saphenofemoral junction traced down to the mid calf through multiple incisions along the medial aspect of the left leg. Branches were ligated with 3 and 4 also cousin divided was removed gently dilated with heparin saline and marked for orientation purposes. It was a good vein proximally but then tapered down to about 2 mm in size distally it was of good quality. Popliteal artery was then exposed and the bony vein harvesting incision it was a soft vessel which is free of disease. The patient was known to have severe tibial occlusive disease. Subfascial anatomic tunnel and the patient was then heparinized. Femoral artery was occluded proximally and distally opened with 15 blade to the previous patch extended with Potts scissors. There was good inflow present and good backbleeding. Proximally the saphenous vein was spatulated and anastomosed end to side with 6-0 Prolene. Clamps  were released there was good pulse and the 4 set incompetent valves. Using the retrograde valvulotome the valves were rendered incompetent residual maximum flow of the distal end of the vein graft. Vein was carefully delivered through the tunnel. Popliteal artery was occluded proximally and distally with Vesseloops a 15 blade and extended with Potts scissors. The popliteal artery itself was free of disease. Vein was carefully measured spatulated and anastomosed end-to-side with 6-0 Prolene. Vesseloops released there was a good pulse in the vein graft and improved Doppler flow in the posterior tibial artery at the ankle as well as the popliteal artery. Intraoperative arteriogram was performed. This revealed vein graft to the marginal in size at the distal anastomosis and there was no fluid is of disease distally in the tibial peroneal trunk as well as the paraneoplastic posterior tibial arteries anterior tibial being occluded. Protamine was given or worsen heparin following adequate hemostasis wounds were closed in layers with Vicryl subcuticular fashion and Dermabond patient at [facility] in stable condition   Josephina Gip, MD 10/27/2012 2:19 PM

## 2012-10-27 NOTE — Anesthesia Preprocedure Evaluation (Addendum)
Anesthesia Evaluation  Patient identified by MRN, date of birth, ID band  Airway Mallampati: II TM Distance: >3 FB Neck ROM: Full    Dental  (+) Edentulous Upper, Edentulous Lower and Dental Advisory Given   Pulmonary shortness of breath and with exertion, asthma , COPD + rhonchi         Cardiovascular hypertension, Pt. on medications + CAD Rhythm:Regular Rate:Normal     Neuro/Psych CVA, No Residual Symptoms    GI/Hepatic GERD-  Medicated,  Endo/Other  Hypothyroidism   Renal/GU Renal InsufficiencyRenal disease     Musculoskeletal   Abdominal (+) + obese,   Peds  Hematology   Anesthesia Other Findings   Reproductive/Obstetrics                        Anesthesia Physical Anesthesia Plan  ASA: III  Anesthesia Plan: General   Post-op Pain Management:    Induction: Intravenous  Airway Management Planned: Oral ETT  Additional Equipment:   Intra-op Plan:   Post-operative Plan: Extubation in OR  Informed Consent: I have reviewed the patients History and Physical, chart, labs and discussed the procedure including the risks, benefits and alternatives for the proposed anesthesia with the patient or authorized representative who has indicated his/her understanding and acceptance.     Plan Discussed with: CRNA and Surgeon  Anesthesia Plan Comments:         Anesthesia Quick Evaluation

## 2012-10-27 NOTE — Transfer of Care (Signed)
Immediate Anesthesia Transfer of Care Note  Patient: [name]  Procedure(s) Performed: Procedure(s) (LRB) with comments: BYPASS GRAFT FEMORAL-POPLITEAL ARTERY (Left) - Vein graft  Patient Location: PACU  Anesthesia Type:General  Level of Consciousness: sedated and responds to stimulation  Airway & Oxygen Therapy: Patient Spontanous Breathing, Patient remains intubated per anesthesia plan and Patient placed on Ventilator (see vital sign flow sheet for setting)  Post-op Assessment: Report given to PACU RN, Patient moving all extremities X 4 and Patient able to stick tongue midline  Post vital signs: Reviewed and unstable  Complications: respiratory complications and Patient weak and remains ventilated per Anesthesiologist, Surgeon aware

## 2012-10-27 NOTE — Interval H&P Note (Signed)
History and Physical Interval Note:  10/27/2012 11:01 AM  [name]  has presented today for surgery, with the diagnosis of Peripheral Vascular Disease  The various methods of treatment have been discussed with the patient and family. After consideration of risks, benefits and other options for treatment, the patient has consented to  Procedure(s) (LRB) with comments: BYPASS GRAFT FEMORAL-POPLITEAL ARTERY (Left) as a surgical intervention .  The patient's history has been reviewed, patient examined, no change in status, stable for surgery.  I have reviewed the patient's chart and labs.  Questions were answered to the patient's satisfaction.     Josephina Gip

## 2012-10-28 ENCOUNTER — Encounter (HOSPITAL_COMMUNITY): Payer: Self-pay | Admitting: Vascular Surgery

## 2012-10-28 LAB — CBC
HCT: 31.3 % — ABNORMAL LOW (ref 39.0–52.0)
MCV: 92.3 fL (ref 78.0–100.0)
RBC: 3.39 MIL/uL — ABNORMAL LOW (ref 4.22–5.81)
WBC: 9.1 10*3/uL (ref 4.0–10.5)

## 2012-10-28 LAB — BASIC METABOLIC PANEL
BUN: 38 mg/dL — ABNORMAL HIGH (ref 6–23)
BUN: 39 mg/dL — ABNORMAL HIGH (ref 6–23)
CO2: 25 mEq/L (ref 19–32)
Calcium: 8.6 mg/dL (ref 8.4–10.5)
Chloride: 104 mEq/L (ref 96–112)
Creatinine, Ser: 2.29 mg/dL — ABNORMAL HIGH (ref 0.50–1.35)
Creatinine, Ser: 2.44 mg/dL — ABNORMAL HIGH (ref 0.50–1.35)
Creatinine, Ser: 2.51 mg/dL — ABNORMAL HIGH (ref 0.50–1.35)
GFR calc Af Amer: 28 mL/min — ABNORMAL LOW (ref >90)
GFR calc Af Amer: 27 mL/min — ABNORMAL LOW (ref >90)
GFR calc non Af Amer: 24 mL/min — ABNORMAL LOW (ref >90)
GFR calc non Af Amer: 23 mL/min — ABNORMAL LOW (ref >90)
Potassium: 6.6 mEq/L (ref 3.5–5.1)

## 2012-10-28 LAB — POTASSIUM: Potassium: 4.9 mEq/L (ref 3.5–5.1)

## 2012-10-28 MED ORDER — SODIUM BICARBONATE 8.4 % IV SOLN
INTRAVENOUS | Status: AC
  Administered 2012-10-28: 50 meq via INTRAVENOUS
  Filled 2012-10-28: qty 50

## 2012-10-28 MED ORDER — SODIUM CHLORIDE 0.9 % IV SOLN
1.0000 g | Freq: Once | INTRAVENOUS | Status: AC
  Administered 2012-10-28: 1 g via INTRAVENOUS
  Filled 2012-10-28: qty 10

## 2012-10-28 MED ORDER — SODIUM POLYSTYRENE SULFONATE 15 GM/60ML PO SUSP
30.0000 g | Freq: Once | ORAL | Status: AC
  Administered 2012-10-28: 30 g via ORAL
  Filled 2012-10-28: qty 120

## 2012-10-28 MED ORDER — OXYCODONE HCL 5 MG PO TABS: 5.0000 mg | ORAL_TABLET | ORAL | Status: AC | PRN

## 2012-10-28 MED ORDER — FUROSEMIDE 10 MG/ML IJ SOLN
10.0000 mg | Freq: Once | INTRAMUSCULAR | Status: AC
  Administered 2012-10-28: 10 mg via INTRAVENOUS

## 2012-10-28 MED ORDER — FUROSEMIDE 10 MG/ML IJ SOLN
INTRAMUSCULAR | Status: AC
  Administered 2012-10-28: 10 mg via INTRAVENOUS
  Filled 2012-10-28: qty 4

## 2012-10-28 MED ORDER — INSULIN ASPART 100 UNIT/ML ~~LOC~~ SOLN
10.0000 [IU] | Freq: Once | SUBCUTANEOUS | Status: AC
  Administered 2012-10-28: 10 [IU] via SUBCUTANEOUS

## 2012-10-28 MED ORDER — SODIUM BICARBONATE 8.4 % IV SOLN
50.0000 meq | Freq: Once | INTRAVENOUS | Status: AC
  Administered 2012-10-28: 50 meq via INTRAVENOUS

## 2012-10-28 MED ORDER — DEXTROSE 50 % IV SOLN
INTRAVENOUS | Status: AC
  Administered 2012-10-28: 50 mL via INTRAVENOUS
  Filled 2012-10-28: qty 50

## 2012-10-28 MED ORDER — DEXTROSE 50 % IV SOLN
1.0000 | Freq: Once | INTRAVENOUS | Status: AC
  Administered 2012-10-28: 50 mL via INTRAVENOUS
  Filled 2012-10-28: qty 50

## 2012-10-28 NOTE — Progress Notes (Signed)
Utilization review completed.  

## 2012-10-28 NOTE — Clinical Documentation Improvement (Signed)
CKD DOCUMENTATION CLARIFICATION QUERY   THIS DOCUMENT IS NOT A PERMANENT PART OF THE MEDICAL RECORD  Please update your documentation within the medical record to reflect your response to this query.                                                                                         10/28/12   Dr. Hart [facility] and[facility]or Associates,  In a better effort to capture your patient's severity of illness, reflect appropriate length of stay and utilization of resources, a review of the patient medical record has revealed the following indicators:  "Renal Insufficiency/Renal disease"  Bedelia Person, MD Anesthesia Assessment                  BUN/Cr./GFR     (white male) 10/06/12     31/1.90/32 10/22/12     40/2.07/29 10/27/12     38/1.93/32 10/27/12     37/2.03/30 10/28/12     38/2.29/26 10/28/12     39/2.44/24   Known history of Hypertension and Peripheral Arterial Disease   Based on your clinical judgment, please document in the progress notes and discharge summary if a condition below provides greater specificity regarding the patient's renal function:   - CKD Stage I -  GFR > OR = 90   - CKD Stage II - GFR 60-89   - CKD Stage III - GFR 30-59   - CKD Stage IV - GFR 15-29   - CKD Stage V - GFR < 15   - ESRD (End Stage Renal Disease)   - Other Condition   - Unable to Clinically determine   In responding to this query please exercise your independent judgment.    The fact that a query is asked, does not imply that any particular answer is desired or expected.     Reviewed: additional documentation in the medical record   Thank You,  Jerral Ralph  RN BSN CCDS Certified Clinical Documentation Specialist: Cell   [phone]  Health Information Management [place]  TO RESPOND TO THE THIS QUERY, FOLLOW THE INSTRUCTIONS BELOW:  1. If needed, update documentation for the patient's encounter via the notes activity.  2. Access this query again and click edit on the In  [employer].  3. After updating, or not, click F2 to complete all highlighted (required) fields concerning your review. Select "additional documentation in the medical record" OR "no additional documentation provided".  4. Click Sign note button.  5. The deficiency will fall out of your In Basket *Please let us know if you are not able to complete this workflow by phone or e-mail (listed below).  Chart reviewed. Patient is CKD lll

## 2012-10-28 NOTE — Progress Notes (Signed)
CRITICAL VALUE ALERT  Critical value received:  K 6.6  Date of notification:  10/28/12  Time of notification:  0814  Critical value read back:yes  Nurse who received alert:  Roselie Awkward, RN  MD notified (1st page):  Lianne Cure PA  Time of first page:  0820  MD notified (2nd page):  Time of second page:  Responding MD:  Lianne Cure PA  Time MD responded:  [phone]

## 2012-10-28 NOTE — Evaluation (Signed)
Physical Therapy Evaluation Patient Details Name: [name] MRN: [id number] DOB: 1933-01-12 Today's Date: 10/28/2012 Time: 9629-5284 PT Time Calculation (min): 32 min  PT Assessment / Plan / Recommendation Clinical Impression  Pt is 78 y/o male admitted s/p left bypass femoral-popliteal graft.  Pt with SOB and decrease BP during evaluation but no c/o dizziness.  Pt will benefit from acute PT services to improve overall mobility and prepare for safe d/c home.    PT Assessment  Patient needs continued PT services    Follow Up Recommendations  Home health PT;Supervision/Assistance - 24 hour    Does the patient have the potential to tolerate intense rehabilitation      Barriers to Discharge None      Equipment Recommendations  None recommended by PT    Recommendations for Other Services  (none)   Frequency Min 3X/week    Precautions / Restrictions Precautions Precautions: Fall   Pertinent Vitals/Pain C/o right LE pain during ambulation Decrease SaO2 with ambulation however questionable waveform- good waveform shows 93% RA BP:  Sitting 97/52 (55), Standing (56), sitting 87/38 (50), end of session 103/57 (68) - pt asymptomatic throughout session        Mobility  Bed Mobility Bed Mobility: Not assessed Transfers Transfers: Sit to Stand;Stand to Sit Sit to Stand: 4: Min guard;From chair/3-in-1 Stand to Sit: 4: Min guard;To chair/3-in-1 Details for Transfer Assistance: Minguard for safety with cues for hand placement and RW placeemnt with sit <> stand Ambulation/Gait Ambulation/Gait Assistance: 4: Min guard Ambulation Distance (Feet): 60 Feet Assistive device: Rolling walker Ambulation/Gait Assistance Details: Pt attempted to ambulate without AD and needed increase time and use of IV pole to maintian balance.  Pt able to improve balance and ambulation with use of RW.  Pt with antalgic gait Gait Pattern: Step-to pattern;Decreased stride length;Antalgic;Trunk  flexed Stairs: No    Shoulder Instructions     Exercises     PT Diagnosis: Difficulty walking;Generalized weakness;Acute pain  PT Problem List: Decreased strength;Decreased activity tolerance;Decreased balance;Decreased mobility;Decreased knowledge of use of DME;Pain PT Treatment Interventions: DME instruction;Gait training;Stair training;Functional mobility training;Therapeutic activities;Therapeutic exercise;Balance training;Patient/family education   PT Goals Acute Rehab PT Goals PT Goal Formulation: With patient Time For Goal Achievement: 11/04/12 Potential to Achieve Goals: Good Pt will go Supine/Side to Sit: with modified independence PT Goal: Supine/Side to Sit - Progress: Goal set today Pt will go Sit to Supine/Side: with modified independence PT Goal: Sit to Supine/Side - Progress: Goal set today Pt will go Sit to Stand: with modified independence PT Goal: Sit to Stand - Progress: Goal set today Pt will go Stand to Sit: with modified independence PT Goal: Stand to Sit - Progress: Goal set today Pt will Ambulate: >150 feet;with modified independence;with rolling walker PT Goal: Ambulate - Progress: Goal set today Pt will Go Up / Down Stairs: 3-5 stairs;with modified independence;with least restrictive assistive device PT Goal: Up/Down Stairs - Progress: Goal set today  Visit Information  Last PT Received On: 10/28/12 Assistance Needed: +1    Subjective Data  Subjective: "I'll do anything to go home today." Patient Stated Goal: To go home    Prior Functioning  Home Living Lives With: Spouse Available Help at Discharge: Family;Available 24 hours/day Type of Home: Mobile home Home Access: Stairs to enter Entrance Stairs-Number of Steps: 6 in front; 3 in back Entrance Stairs-Rails: Left Home Layout: One level Bathroom Shower/Tub: Walk-in shower;Door [employer] Toilet: Standard Home Adaptive Equipment: Walker - rolling;Straight cane;Crutches;Shower chair with  back;Hand-held  shower hose Prior Function Level of Independence: Independent Able to Take Stairs?: Reciprically Driving: Yes Vocation: Retired Comments: pt used to golf and would like to get back to this Communication Communication: No difficulties Dominant Hand: Left    Cognition  Overall Cognitive Status: Appears within functional limits for tasks assessed/performed Arousal/Alertness: Awake/alert Orientation Level: Appears intact for tasks assessed Behavior During Session: [facility] for tasks performed    Extremity/Trunk Assessment Right Lower Extremity Assessment RLE ROM/Strength/Tone: Within functional levels RLE Sensation: History of peripheral neuropathy Left Lower Extremity Assessment LLE ROM/Strength/Tone: Unable to fully assess;Due to pain   Balance Balance Balance Assessed: No  End of Session PT - End of Session Equipment Utilized During Treatment: Gait belt;Oxygen Activity Tolerance: Patient tolerated treatment well;Patient limited by pain Patient left: in chair;with call bell/phone within reach Nurse Communication: Mobility status  GP     [name] 10/28/2012, 10:10 AM Jake Shark, PT DPT [phone]

## 2012-10-28 NOTE — Evaluation (Signed)
Occupational Therapy Evaluation Patient Details Name: [name] MRN: [id number] DOB: Jun 15, 1933 Today's Date: 10/28/2012 Time: 3086-5784 OT Time Calculation (min): 32 min  OT Assessment / Plan / Recommendation Clinical Impression  Pt is 78 y/o male admitted s/p left bypass femoral-popliteal graft.  Pt with SOB and decrease BP during evaluation but no c/o dizziness.  Pt will benefit from acute OT to maximize I with ADL and ADL mobility prior to d/c.     OT Assessment  Patient needs continued OT Services    Follow Up Recommendations  Home health OT;Supervision/Assistance - 24 hour    Barriers to Discharge      Equipment Recommendations  None recommended by PT;None recommended by OT    Recommendations for Other Services    Frequency  Min 2X/week    Precautions / Restrictions Precautions Precautions: Fall Restrictions Weight Bearing Restrictions: No   Pertinent Vitals/Pain Pt denies LLE pain but instead, reports RLE pain- did not rate. Decrease SaO2 with ambulation however questionable waveform- good waveform shows 93% RA  BP: Sitting 97/52 (55), Standing (56), sitting 87/38 (50), end of session 103/57 (68) - pt asymptomatic throughout session     ADL  Grooming: Min guard Where Assessed - Grooming: Supported standing Upper Body Bathing: Min guard Where Assessed - Upper Body Bathing: Unsupported sitting Lower Body Bathing: Min guard Where Assessed - Lower Body Bathing: Unsupported sit to stand Lower Body Dressing: Moderate assistance Where Assessed - Lower Body Dressing: Unsupported sitting Toilet Transfer: Min Pension scheme manager Method: Sit to Barista: Materials engineer and Hygiene: Min guard Where Assessed - Engineer, mining and Hygiene: Sit to stand from 3-in-1 or toilet Tub/Shower Transfer: Min guard Tub/Shower Transfer Method: Ambulating (backward shower entry over ledge) Tub/Shower  Transfer Equipment: Walk in shower Equipment Used: Gait belt;Rolling walker Transfers/Ambulation Related to ADLs: min guard assist with RW ambulation throughout room and into hallway ADL Comments: pt educated on AE use for LB dsg; pt able to verbalize steps for backward shower entry and forward exit with no VC's    OT Diagnosis: Generalized weakness;Acute pain  OT Problem List: Decreased activity tolerance;Impaired balance (sitting and/or standing);Decreased knowledge of use of DME or AE;Decreased knowledge of precautions;Pain;Cardiopulmonary status limiting activity OT Treatment Interventions: Self-care/ADL training;DME and/or AE instruction;Therapeutic activities;Patient/family education;Balance training   OT Goals Acute Rehab OT Goals OT Goal Formulation: With patient Time For Goal Achievement: 11/04/12 Potential to Achieve Goals: Good ADL Goals Pt Will Perform Grooming: Independently;Standing at sink ADL Goal: Grooming - Progress: Goal set today Pt Will Perform Lower Body Dressing: with adaptive equipment;Sit to stand from chair;Sit to stand from bed;with min assist;with supervision ADL Goal: Lower Body Dressing - Progress: Goal set today Pt Will Transfer to Toilet: with modified independence;Ambulation;with DME ADL Goal: Toilet Transfer - Progress: Goal set today Pt Will Perform Toileting - Clothing Manipulation: Independently;Standing ADL Goal: Toileting - Clothing Manipulation - Progress: Goal set today Pt Will Perform Tub/Shower Transfer: Shower transfer;Ambulation;with DME;with modified independence ADL Goal: Tub/Shower Transfer - Progress: Goal set today  Visit Information  Last OT Received On: 10/28/12 Assistance Needed: +1    Subjective Data  Subjective: I used to golf before my legs got like this Patient Stated Goal: Return home   Prior Functioning     Home Living Lives With: Spouse Available Help at Discharge: Family;Available 24 hours/day Type of Home: Mobile  home Home Access: Stairs to enter Entrance Stairs-Number of Steps: 6 in front; 3 in  back Entrance Stairs-Rails: Left Home Layout: One level Bathroom Shower/Tub: Walk-in shower;Door [employer] Toilet: Standard Home Adaptive Equipment: Walker - rolling;Straight cane;Crutches;Shower chair with back;Hand-held shower hose Prior Function Level of Independence: Independent Able to Take Stairs?: Reciprically Driving: Yes Vocation: Retired Comments: pt used to golf and would like to get back to this Communication Communication: No difficulties Dominant Hand: Left         Vision/Perception     Cognition  Overall Cognitive Status: Appears within functional limits for tasks assessed/performed Arousal/Alertness: Awake/alert Orientation Level: Appears intact for tasks assessed Behavior During Session: [facility] for tasks performed    Extremity/Trunk Assessment Right Upper Extremity Assessment RUE ROM/Strength/Tone: WFL for tasks assessed RUE Sensation: WFL - Light Touch RUE Coordination: WFL - gross/fine motor Left Upper Extremity Assessment LUE ROM/Strength/Tone: WFL for tasks assessed LUE Sensation: WFL - Light Touch LUE Coordination: WFL - gross/fine motor Right Lower Extremity Assessment RLE ROM/Strength/Tone: Within functional levels RLE Sensation: History of peripheral neuropathy Left Lower Extremity Assessment LLE ROM/Strength/Tone: Unable to fully assess;Due to pain     Mobility Bed Mobility Bed Mobility: Not assessed Transfers Sit to Stand: 4: Min guard;From chair/3-in-1 Stand to Sit: 4: Min guard;To chair/3-in-1 Details for Transfer Assistance: Minguard for safety with cues for hand placement and RW placeemnt with sit <> stand     Shoulder Instructions     Exercise     Balance Balance Balance Assessed: No   End of Session OT - End of Session Equipment Utilized During Treatment: Gait belt Activity Tolerance: Patient tolerated treatment well Patient left: in  chair;with call bell/phone within reach Nurse Communication: Mobility status  GO     Krrish Freund 10/28/2012, 10:28 AM

## 2012-10-28 NOTE — Progress Notes (Addendum)
[facility]  Progress Note Bypass Surgery  Date of Surgery: 10/27/2012  Procedure(s): BYPASS GRAFT FEMORAL-POPLITEAL ARTERY Surgeon: Surgeon(s): Pryor Ochoa, MD  1 Day Post-Op  History of Present Illness  [name] is a 78 y.o. male who is S/P Procedure(s): BYPASS GRAFT FEMORAL-POPLITEAL ARTERY left.  The patient's pre-op symptoms of pain are Improved . Patients pain is well controlled.  He has been hypotensive and on dopamine 10 mc per kg per min.  BP 109/35  VASC. LAB Studies:       pending   Imaging: Dg Ang/ext/uni/or Left  10/27/2012  *RADIOLOGY REPORT*  Clinical data:  Bypass graft  INTRAOPERATIVE ARTERIOGRAM  Findings:  No previous for comparison.  There is a vein graft to the tibioperoneal trunk.  Distal anastomosis appears patent.  There is contiguous but diseased posterior tibial and peroneal runoff to the lower calf level, lower margin of the image.  There is some opacification of the proximal anterior tibial artery, not seen below the mid calf.  IMPRESSION: 1.  Patent vein graft to the tibioperoneal trunk with diseased two vessel runoff   Original Report Authenticated By: D. Andria Rhein, MD     Significant Diagnostic Studies: CBC Lab Results  Component Value Date   WBC 9.1 10/28/2012   HGB 10.5* 10/28/2012   HCT 31.3* 10/28/2012   MCV 92.3 10/28/2012   PLT 135* 10/28/2012    BMET     Component Value Date/Time   NA 135 10/28/2012 0500   K 6.9* 10/28/2012 0500   CL 104 10/28/2012 0500   CO2 25 10/28/2012 0500   GLUCOSE 101* 10/28/2012 0500   BUN 38* 10/28/2012 0500   CREATININE 2.29* 10/28/2012 0500   CALCIUM 8.1* 10/28/2012 0500   GFRNONAA 26* 10/28/2012 0500   GFRAA 30* 10/28/2012 0500    COAG Lab Results  Component Value Date   INR 0.96 10/22/2012   No results found for this basename: PTT    Physical Examination  BP Readings from Last 3 Encounters:  10/28/12 95/47  10/28/12 95/47  10/25/12 196/80    Temp Readings from Last 3 Encounters:  10/28/12 98 F (36.7 C) Oral  10/28/12 98 F (36.7 C) Oral  10/22/12 98.6 F (37 C)    SpO2 Readings from Last 3 Encounters:  10/28/12 94%  10/28/12 94%  10/22/12 97%   Pulse Readings from Last 3 Encounters:  10/28/12 98  10/28/12 98  10/25/12 75    Pt is A&O x 3 left lower extremity: Incision/s is/are clean,dry.intact, and  healing without hematoma, erythema or drainage Limb is warm; with good color PT pulses monophasic faint bilateral via doppler this am.    Assessment/Plan: Pt. Doing well Post-op pain is controlled Wounds are healing well PT/OT for ambulation Potassium was read as 6.9 with pre-op 5.1.   We will continue the dopamine and try to wean if BP stabilizes. Potassium was re-ordered and we will order a 12 lead EKG   Clinton Gallant [facility] [phone] 10/28/2012 7:49 AM        Agree with the above assessment Wounds were good 2+ popliteal vein graft pulse palpable with good PT flow distally and pink warm foot Repeating potassium after treatment for hyperkalemia-recheck at noon today Excellent urinary output Patient ambulated in the hall  Plan DC home in a.m. if electrolytes in good control and blood pressure satisfactory  Tissue Foley catheter at time of discharge-urinary incontinence

## 2012-10-29 ENCOUNTER — Telehealth: Payer: Self-pay | Admitting: Vascular Surgery

## 2012-10-29 DIAGNOSIS — Z48812 Encounter for surgical aftercare following surgery on the circulatory system: Secondary | ICD-10-CM

## 2012-10-29 LAB — URINE CULTURE: Colony Count: >=100000

## 2012-10-29 LAB — BASIC METABOLIC PANEL
BUN: 37 mg/dL — ABNORMAL HIGH (ref 6–23)
GFR calc non Af Amer: 28 mL/min — ABNORMAL LOW (ref >90)

## 2012-10-29 NOTE — Progress Notes (Signed)
Pt foley d/c per MD order, stated not to d/c foley until pt ready for d/c. Pt with uncontrolled bladder sphincter cont dribbling after foley d/c. D/c instructions and prescriptions given, pt VSS, family at [facility], verbalized understanding of d/c and instructions, all questions answered

## 2012-10-29 NOTE — Progress Notes (Signed)
VASCULAR LAB PRELIMINARY  ARTERIAL  ABI completed:    RIGHT    LEFT    PRESSURE WAVEFORM  PRESSURE WAVEFORM  BRACHIAL 153 Triphasic  BRACHIAL 151 Triphasic  DP 71 Monophasic  DP 94 Monophasic   AT   AT    PT 72 Monophasic  PT 83 Monophasic   PER   PER    GREAT TOE  NA GREAT TOE  NA    RIGHT LEFT  ABI 0.47 0.61     [name], RVT 10/29/2012, 12:59 PM

## 2012-10-29 NOTE — Progress Notes (Addendum)
[facility] [place]  Progress Note Bypass Surgery  Date of Surgery: 10/27/2012  Procedure(s): BYPASS GRAFT FEMORAL-POPLITEAL ARTERY Surgeon: Surgeon(s): Pryor Ochoa, MD  2 Days Post-Op  History of Present Illness  [name] is a 78 y.o. male who is S/P Procedure(s): BYPASS GRAFT FEMORAL-POPLITEAL ARTERY left.  The patient's pre-op symptoms of pain are Improved . Patients pain is well controlled.  EKG was done due to hyperkalemia showing non specific T wave changes.  Potassium on admission was 5.1.  Today potassium is 5,2 down from 6.9 at it's highest level post-op.  He has received 1 amp of bicarb, calcium gluconate, insulin and lasix.  Followed by Kayexalate 30 G resulting is the potassium of 5.2.   VASC. LAB Studies:        pending   Imaging: Dg Ang/ext/uni/or Left  10/27/2012  *RADIOLOGY REPORT*  Clinical data:  Bypass graft  INTRAOPERATIVE ARTERIOGRAM  Findings:  No previous for comparison.  There is a vein graft to the tibioperoneal trunk.  Distal anastomosis appears patent.  There is contiguous but diseased posterior tibial and peroneal runoff to the lower calf level, lower margin of the image.  There is some opacification of the proximal anterior tibial artery, not seen below the mid calf.  IMPRESSION: 1.  Patent vein graft to the tibioperoneal trunk with diseased two vessel runoff   Original Report Authenticated By: D. Andria Rhein, MD     Significant Diagnostic Studies: CBC Lab Results  Component Value Date   WBC 9.1 10/28/2012   HGB 10.5* 10/28/2012   HCT 31.3* 10/28/2012   MCV 92.3 10/28/2012   PLT 135* 10/28/2012    BMET     Component Value Date/Time   NA 129* 10/29/2012 0430   K 5.2* 10/29/2012 0430   CL 96 10/29/2012 0430   CO2 21 10/29/2012 0430   GLUCOSE 98 10/29/2012 0430   BUN 37* 10/29/2012 0430   CREATININE 2.14* 10/29/2012 0430   CALCIUM 8.5 10/29/2012 0430   GFRNONAA 28* 10/29/2012 0430   GFRAA 33* 10/29/2012 0430      COAG Lab Results  Component Value Date   INR 0.96 10/22/2012   No results found for this basename: PTT    Physical Examination  BP Readings from Last 3 Encounters:  10/29/12 149/30  10/29/12 149/30  10/25/12 196/80   Temp Readings from Last 3 Encounters:  10/29/12 97.8 F (36.6 C) Oral  10/29/12 97.8 F (36.6 C) Oral  10/22/12 98.6 F (37 C)    SpO2 Readings from Last 3 Encounters:  10/29/12 94%  10/29/12 94%  10/22/12 97%   Pulse Readings from Last 3 Encounters:  10/29/12 86  10/29/12 86  10/25/12 75    Pt is A&O x 3 left lower extremity: Incision/s is/are clean,dry.intact, and  healing without hematoma, erythema or drainage Limb is warm; with good color PT pulses monophasic faint bilateral via doppler this am.      Assessment/Plan: Pt. Doing well Post-op pain is controlled Wounds are healing well D/C home hi potassium is 5.2 he will F/U with his medical doctor.  Clinton Gallant [facility] [phone] 10/29/2012 7:26 AM  Doing well. For D/C today.  Cari Caraway Beeper [phone] 10/29/2012

## 2012-10-29 NOTE — Telephone Encounter (Addendum)
Message copied by Shari Prows on Fri Oct 29, 2012 11:17 AM ------      Message from: Melene Plan      Created: Fri Oct 29, 2012 10:05 AM       Have PCP fax [place] his lab work please closes to the day      ----- Message -----         From: Lars Mage, PA         Sent: 10/29/2012   7:45 AM           To: Melene Plan, RN            F/U with Dr. Hart Rochester in 4 weeks left fem-pop bypass.  Check hyperkalemia verbally he is following up with medical MD for potassium levels.       I scheduled an appt for above pt on 11/23/12 at 8:30am w/ [facility]. I left a message for the pt regarding this and also mailed an appt letter.awt

## 2012-10-29 NOTE — Discharge Summary (Signed)
[facility] Discharge Summary   Patient ID:  [name] MRN: [id number] DOB/AGE: 1933/06/03 78 y.o.  Admit date: 10/27/2012 Discharge date: 10/29/2012 Date of Surgery: 10/27/2012 Surgeon: Surgeon(s): Pryor Ochoa, MD  Admission Diagnosis: PVD  Discharge Diagnoses:  PVD  Secondary Diagnoses: Past Medical History  Diagnosis Date  . Hyperlipidemia   . Peripheral vascular disease   . Cancer 2011    Right kidney   . SOB (shortness of breath) on exertion   . Stroke     TIA  . Psoriasis   . Coronary artery disease   . COPD (chronic obstructive pulmonary disease)   . Asthma   . GERD (gastroesophageal reflux disease)   . Hypertension     takes meds daily  . Hypothyroidism     Procedure(s): BYPASS GRAFT FEMORAL-POPLITEAL ARTERY  Discharged Condition: good  HPI:  This 78 year old male returns today to discuss the findings of his angiogram performed a few weeks ago by Dr. Myra Gianotti. The patient had 2 nonhealing ulcers in the distal left pretibial area. He was found to have severe superficial femoral and popliteal occlusive disease with patent below knee popliteal artery and severe tibial disease. Patient had extensive external iliac common femoral and profunda femoral endarterectomy by me in the past. Patient's ulcers have currently healed. He is having severe claudication being unable to ambulate one quarter of a block. He has similar symptoms in the right leg. He had a Cardiolite performed a few weeks ago which revealed a good ejection fraction-54% and no evidence of ischemia   Hospital Course:  [name] is a 78 y.o. male is S/P Left Procedure(s): BYPASS GRAFT FEMORAL-POPLITEAL ARTERY Extubated: POD # 0 Post-op wounds healing well Pt. Ambulating, voiding and taking PO diet without difficulty. Pt pain controlled with PO pain meds. Labs as below Complications:none  Consults:     Significant Diagnostic Studies: CBC Lab Results  Component  Value Date   WBC 9.1 10/28/2012   HGB 10.5* 10/28/2012   HCT 31.3* 10/28/2012   MCV 92.3 10/28/2012   PLT 135* 10/28/2012    BMET    Component Value Date/Time   NA 129* 10/29/2012 0430   K 5.2* 10/29/2012 0430   CL 96 10/29/2012 0430   CO2 21 10/29/2012 0430   GLUCOSE 98 10/29/2012 0430   BUN 37* 10/29/2012 0430   CREATININE 2.14* 10/29/2012 0430   CALCIUM 8.5 10/29/2012 0430   GFRNONAA 28* 10/29/2012 0430   GFRAA 33* 10/29/2012 0430   COAG Lab Results  Component Value Date   INR 0.96 10/22/2012     Disposition:  Discharge to :Home Discharge Orders    Future Appointments: Provider: Department: Dept Phone: Center:   05/25/2013 10:00 AM [facility] [phone] [facility]   05/25/2013 11:00 AM [facility] [phone] [facility]   05/25/2013 11:40 AM Evern Bio, NP [facility] [phone] [facility]     Future Orders Please Complete By Expires   Resume previous diet      Driving Restrictions      Comments:   No driving for 2 weeks   Lifting restrictions      Comments:   No lifting for 2 weeks   Call MD for:  temperature >100.5      Call MD for:  redness, tenderness, or signs of infection (pain, swelling, bleeding, redness, odor or green/yellow discharge around incision site)  Call MD for:  severe or increased pain, loss or decreased feeling  in affected limb(s)      Increase activity slowly      Comments:   Walk with assistance use walker or cane as needed   May shower          [name], [name]  Home Medication Instructions [id number]   Printed on:10/29/12 0736  Medication Information                    aspirin 325 MG tablet Take 325 mg by mouth daily.           levothyroxine (SYNTHROID, LEVOTHROID) 75 MCG tablet Take 75 mcg by mouth daily.           pravastatin (PRAVACHOL) 40 MG tablet Take 40 mg by mouth daily.           losartan (COZAAR) 100  MG tablet Take 100 mg by mouth daily.           folic acid (FOLVITE) 400 MCG tablet Take 400 mcg by mouth daily.           fluocinonide ointment (LIDEX) 0.05 % Apply topically 2 (two) times daily.           doxazosin (CARDURA) 2 MG tablet Take 2 mg by mouth at bedtime.           methotrexate (RHEUMATREX) 2.5 MG tablet Take 12.5 mg by mouth once a week. Caution:Chemotherapy. Protect from light; Takes 5 tablets to get 12.5 mg dose, takes weekly on Monday.           naproxen sodium (ANAPROX) 220 MG tablet Take 220 mg by mouth at bedtime as needed.           oxyCODONE (OXY IR/ROXICODONE) 5 MG immediate release tablet Take 1-2 tablets (5-10 mg total) by mouth every 4 (four) hours as needed.            Verbal and written Discharge instructions given to the patient. Wound care per Discharge AVS D/C foley prior to discharge  Signed: Clinton Gallant [facility] 10/29/2012, 7:36 AM    Agree with plans for D/C today.  [name] Beeper [phone] 10/29/2012

## 2012-10-29 NOTE — Progress Notes (Signed)
Physical Therapy Treatment Patient Details Name: [name] MRN: [id number] DOB: May 07, 1933 Today's Date: 10/29/2012 Time: 0865-7846 PT Time Calculation (min): 28 min  PT Assessment / Plan / Recommendation Comments on Treatment Session  Pt able to increase ambulation distance and complete stair negotiation.  Educated pt on energy conservation and increase ambulation to improve overall endurance.    Follow Up Recommendations  Home health PT;Supervision/Assistance - 24 hour     Does the patient have the potential to tolerate intense rehabilitation     Barriers to Discharge        Equipment Recommendations  None recommended by PT;None recommended by OT    Recommendations for Other Services    Frequency Min 3X/week   Plan Discharge plan remains appropriate;Frequency remains appropriate    Precautions / Restrictions Precautions Precautions: Fall   Pertinent Vitals/Pain No c/o pain    Mobility  Bed Mobility Bed Mobility: Supine to Sit;Sit to Supine Supine to Sit: 6: Modified independent (Device/Increase time) Sit to Supine: 6: Modified independent (Device/Increase time) Transfers Transfers: Sit to Stand;Stand to Sit Sit to Stand: 5: Supervision;From bed Stand to Sit: 5: Supervision;To bed Details for Transfer Assistance: Supervision for safety and cues for hand placement Ambulation/Gait Ambulation/Gait Assistance: 4: Min guard;5: Supervision Ambulation Distance (Feet): 100 Feet Assistive device: Rolling walker Ambulation/Gait Assistance Details: Minguard for safety due to lines and able to progress to supervision. Stairs: Yes Stairs Assistance: 4: Min guard Stair Management Technique: Two rails;Step to pattern;Forwards Number of Stairs: 2     Exercises     PT Diagnosis:    PT Problem List:   PT Treatment Interventions:     PT Goals Acute Rehab PT Goals PT Goal Formulation: With patient Time For Goal Achievement: 11/04/12 Potential to Achieve Goals: Good Pt  will go Supine/Side to Sit: with modified independence PT Goal: Supine/Side to Sit - Progress: Met Pt will go Sit to Supine/Side: with modified independence PT Goal: Sit to Supine/Side - Progress: Met Pt will go Sit to Stand: with modified independence PT Goal: Sit to Stand - Progress: Progressing toward goal Pt will go Stand to Sit: with modified independence PT Goal: Stand to Sit - Progress: Progressing toward goal Pt will Ambulate: >150 feet;with modified independence;with rolling walker PT Goal: Ambulate - Progress: Progressing toward goal Pt will Go Up / Down Stairs: 3-5 stairs;with modified independence;with least restrictive assistive device PT Goal: Up/Down Stairs - Progress: Progressing toward goal  Visit Information  Last PT Received On: 10/29/12 Assistance Needed: +1    Subjective Data  Subjective: "I'm waiting for procedure then I'm leaving today." Patient Stated Goal: To go home    Cognition  Overall Cognitive Status: Appears within functional limits for tasks assessed/performed Arousal/Alertness: Awake/alert Orientation Level: Appears intact for tasks assessed Behavior During Session: [facility] for tasks performed    Balance     End of Session PT - End of Session Equipment Utilized During Treatment: Gait belt Activity Tolerance: Patient tolerated treatment well Patient left: in bed;with call bell/phone within reach Nurse Communication: Mobility status   GP     [name] 10/29/2012, 1:17 PM Jake Shark, PT DPT [phone]

## 2012-11-01 ENCOUNTER — Telehealth: Payer: Self-pay

## 2012-11-01 NOTE — Telephone Encounter (Signed)
Phone call from pt. Reports left leg incision has increased redness, warmth, and bloody drainage.  States the incision remains intact.  Denies any fever/ chills.  Appt. Offered for 11/02/12 @ 10:45 AM for evaluation.  Agrees w/ plan.

## 2012-11-02 ENCOUNTER — Encounter: Payer: Self-pay | Admitting: Vascular Surgery

## 2012-11-02 ENCOUNTER — Ambulatory Visit (INDEPENDENT_AMBULATORY_CARE_PROVIDER_SITE_OTHER): Payer: Medicare Other | Admitting: Vascular Surgery

## 2012-11-02 VITALS — BP 140/51 | HR 84 | Temp 98.2°F | Ht 68.0 in | Wt 216.0 lb

## 2012-11-02 DIAGNOSIS — I70219 Atherosclerosis of native arteries of extremities with intermittent claudication, unspecified extremity: Secondary | ICD-10-CM

## 2012-11-02 NOTE — Progress Notes (Signed)
Subjective:     Patient ID: [name], male   DOB: May 24, 1933, 78 y.o.   MRN: [id number]  HPI this 78 year old male returns 1 week post left femoral-popliteal bypass graft using nonreversible saphenous vein for severe claudication symptoms. He had previously had 2 nonhealing ulcers in the left pretibial region which had healed. His wife is concerned about some drainage from the distal vein harvesting wound below the knee. He has had significant swelling. He has had no chills and fever.   Review of Systems     Objective:   Physical ExamBP 140/51  Pulse 84  Temp 98.2 F (36.8 C) (Oral)  Ht 5\' 8"  (1.727 m)  Wt 216 lb (97.977 kg)  BMI 32.84 kg/m2  SpO2 98%  General well-developed well-nourished male in no apparent stress alert and oriented x3 Left lower extremity examination reveals well healing incisions where the vein was harvested. There was minimal separation in the midportion of the below knee wound with no active drainage at this time. There is no cellulitis. He does have some significant edema in the 1-2+ range with erythema in the distal third of the leg where the skin was scaly preoperatively. No active ulcers are noted. He has excellent brisk flow in the left posterior tibial artery at the ankle.     Assessment:     1-2+ edema status post left femoral-popliteal bypass graft with saphenous vein-incisions healing nicely with no evidence of infection or significant drainage at this time Femoral-popliteal graft functioning well    Plan:     Patient needs to elevate his leg at night and frequently during the day and also keep wounds clean and dry. They're healing satisfactorily and the bypass is functioning well Will return in 6 weeks with ABIs at that time

## 2012-11-03 ENCOUNTER — Telehealth: Payer: Self-pay | Admitting: Vascular Surgery

## 2012-11-03 NOTE — Addendum Note (Signed)
Addended by: Sharee Pimple on: 11/03/2012 01:33 PM   Modules accepted: Orders

## 2012-11-05 NOTE — Telephone Encounter (Signed)
No telephone encounter

## 2012-11-23 ENCOUNTER — Ambulatory Visit: Payer: Medicare Other | Admitting: Vascular Surgery

## 2012-12-13 ENCOUNTER — Other Ambulatory Visit: Payer: Self-pay | Admitting: *Deleted

## 2012-12-13 DIAGNOSIS — I70219 Atherosclerosis of native arteries of extremities with intermittent claudication, unspecified extremity: Secondary | ICD-10-CM

## 2012-12-13 DIAGNOSIS — Z48812 Encounter for surgical aftercare following surgery on the circulatory system: Secondary | ICD-10-CM

## 2012-12-20 ENCOUNTER — Encounter: Payer: Self-pay | Admitting: Vascular Surgery

## 2012-12-21 ENCOUNTER — Encounter: Payer: Self-pay | Admitting: Vascular Surgery

## 2012-12-21 ENCOUNTER — Ambulatory Visit (INDEPENDENT_AMBULATORY_CARE_PROVIDER_SITE_OTHER): Payer: Medicare Other | Admitting: Vascular Surgery

## 2012-12-21 ENCOUNTER — Encounter (INDEPENDENT_AMBULATORY_CARE_PROVIDER_SITE_OTHER): Payer: Medicare Other | Admitting: *Deleted

## 2012-12-21 VITALS — BP 152/70 | HR 82 | Resp 22 | Ht 68.0 in | Wt 210.0 lb

## 2012-12-21 DIAGNOSIS — I739 Peripheral vascular disease, unspecified: Secondary | ICD-10-CM

## 2012-12-21 DIAGNOSIS — I70219 Atherosclerosis of native arteries of extremities with intermittent claudication, unspecified extremity: Secondary | ICD-10-CM

## 2012-12-21 DIAGNOSIS — Z48812 Encounter for surgical aftercare following surgery on the circulatory system: Secondary | ICD-10-CM

## 2012-12-21 NOTE — Progress Notes (Signed)
Subjective:     Patient ID: [name], male   DOB: [birth year], 79 y.o.   MRN: [id number]  HPI 79 year old male had left femoral-popliteal bypass graft with saphenous vein performed at November 2013. Claudication symptoms are essentially relieved in the left leg. He is limited by right calf claudication but probably more limited by dyspnea on exertion. States swelling in the left leg has improved and the ulcers have healed.   Review of Systems     Objective:   Physical ExamBP 152/70  Pulse 82  Resp 22  Ht 5\' 8"  (1.727 m)  Wt 210 lb (95.255 kg)  BMI 31.93 kg/m2  General well-developed well-nourished male in no apparent stress alert and oriented x3 Left leg with 3+ femoral 2+ popliteal graft pulse. 1+ chronic edema with some scaly thickening of the skin from the knee distally. Vein harvesting incisions have completely healed and nonhealing ulcers in the pretibial region are healed. Right leg has 3+ femoral pulse with absent popliteal and distal pulses no ischemic ulcers noted  Today I ordered lower extremity ABIs bilaterally. Right leg is stable at 52%. Left leg is now 77% compared to 59% preoperatively.    Assessment:     Patent left femoral-popliteal saphenous vein graft with well-healed incisions chronic 1+ edema Right superficial femoral occlusive disease with stable claudication    Plan:     Return June 20 14th of duplex scan left femoral-popliteal vein graft in her repeat ABIs bilaterally. The patient develops worsening symptoms in the right leg he will be candidate for right femoral-popliteal bypass graft. Currently no urgency involved since he has no rest pain nonhealing ulcers infection or gangrene and is limited by dyspnea on exertion.

## 2013-01-03 ENCOUNTER — Other Ambulatory Visit: Payer: Self-pay | Admitting: *Deleted

## 2013-01-03 ENCOUNTER — Telehealth: Payer: Self-pay | Admitting: Vascular Surgery

## 2013-01-03 DIAGNOSIS — I739 Peripheral vascular disease, unspecified: Secondary | ICD-10-CM

## 2013-01-03 DIAGNOSIS — Z48812 Encounter for surgical aftercare following surgery on the circulatory system: Secondary | ICD-10-CM

## 2013-01-03 NOTE — Telephone Encounter (Signed)
Message copied by Sara Chu on Mon Jan 03, 2013 10:26 AM ------      Message from: [place], [place] K      Created: Mon Jan 03, 2013 10:00 AM      Regarding: RE: Orders       I added the Left arterial duplex.             ----- Message -----         From: Sara Chu         Sent: 12/29/2012   3:25 PM           To: Sharee Pimple, CMA      Subject: Orders                                                   Joyce Gross -- JDL ordered for this patient to have ABIs and a left LE arterial duplex per discharge instructions on 12/21/12. The appointment is scheduled for 05/25/13. He is only scheduled for ABIs and a carotid that was scheduled by me, per you. Does the patient need the arterial on this day too, if so could you put in the order. Thanks

## 2013-05-25 ENCOUNTER — Encounter (INDEPENDENT_AMBULATORY_CARE_PROVIDER_SITE_OTHER): Payer: Medicare Other | Admitting: Vascular Surgery

## 2013-05-25 ENCOUNTER — Ambulatory Visit: Payer: PRIVATE HEALTH INSURANCE | Admitting: Neurosurgery

## 2013-05-25 ENCOUNTER — Ambulatory Visit: Payer: Self-pay | Admitting: Neurosurgery

## 2013-05-25 ENCOUNTER — Other Ambulatory Visit: Payer: PRIVATE HEALTH INSURANCE

## 2013-05-25 ENCOUNTER — Other Ambulatory Visit (INDEPENDENT_AMBULATORY_CARE_PROVIDER_SITE_OTHER): Payer: Medicare Other | Admitting: Vascular Surgery

## 2013-05-25 DIAGNOSIS — Z48812 Encounter for surgical aftercare following surgery on the circulatory system: Secondary | ICD-10-CM

## 2013-05-25 DIAGNOSIS — I739 Peripheral vascular disease, unspecified: Secondary | ICD-10-CM

## 2013-05-25 DIAGNOSIS — I6529 Occlusion and stenosis of unspecified carotid artery: Secondary | ICD-10-CM

## 2013-05-26 ENCOUNTER — Other Ambulatory Visit: Payer: Self-pay

## 2013-05-26 DIAGNOSIS — I739 Peripheral vascular disease, unspecified: Secondary | ICD-10-CM

## 2013-05-26 DIAGNOSIS — Z48812 Encounter for surgical aftercare following surgery on the circulatory system: Secondary | ICD-10-CM

## 2013-05-30 ENCOUNTER — Encounter: Payer: Self-pay | Admitting: Vascular Surgery

## 2013-06-20 IMAGING — CR DG CHEST 2V
2 series · 2 of 2 positions shown · non-contrast
Comparison: 10/23/2011

CLINICAL DATA: Preop for bypass graft

CHEST - 2 VIEW

[view not recorded (1 of 2)]
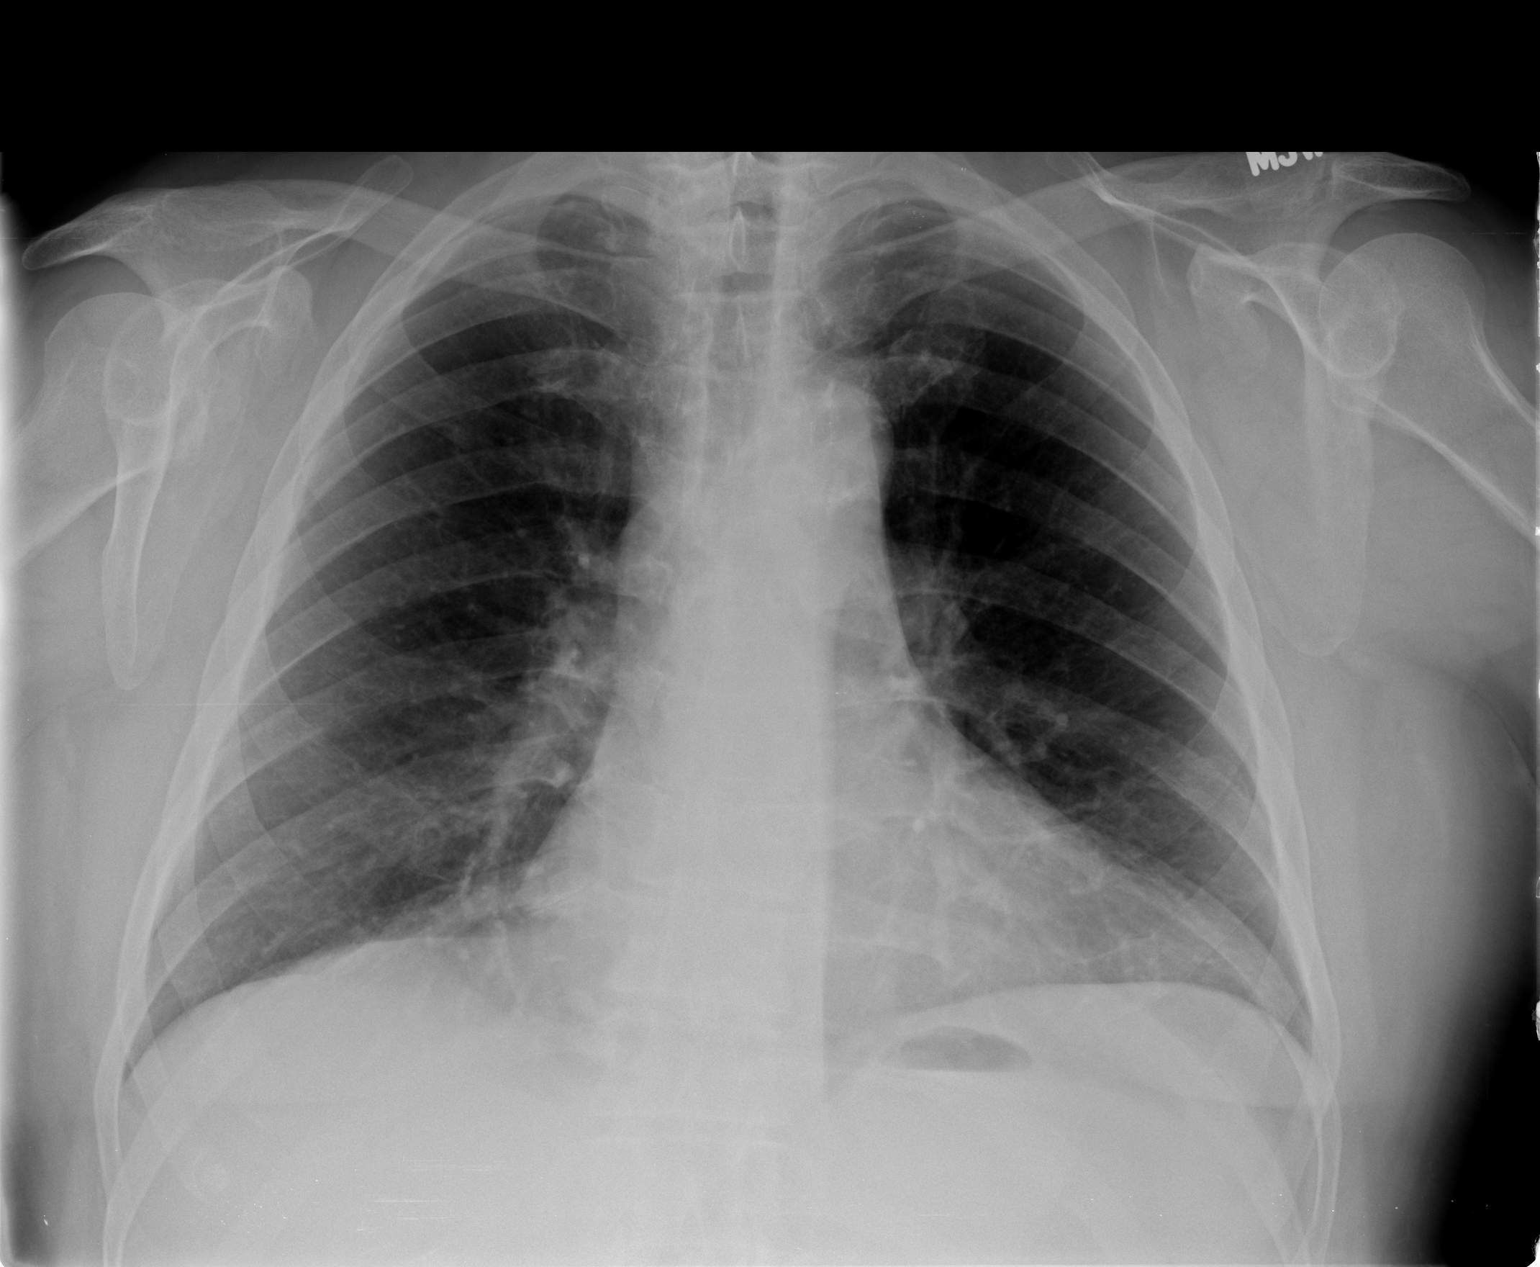

[view not recorded (2 of 2)]
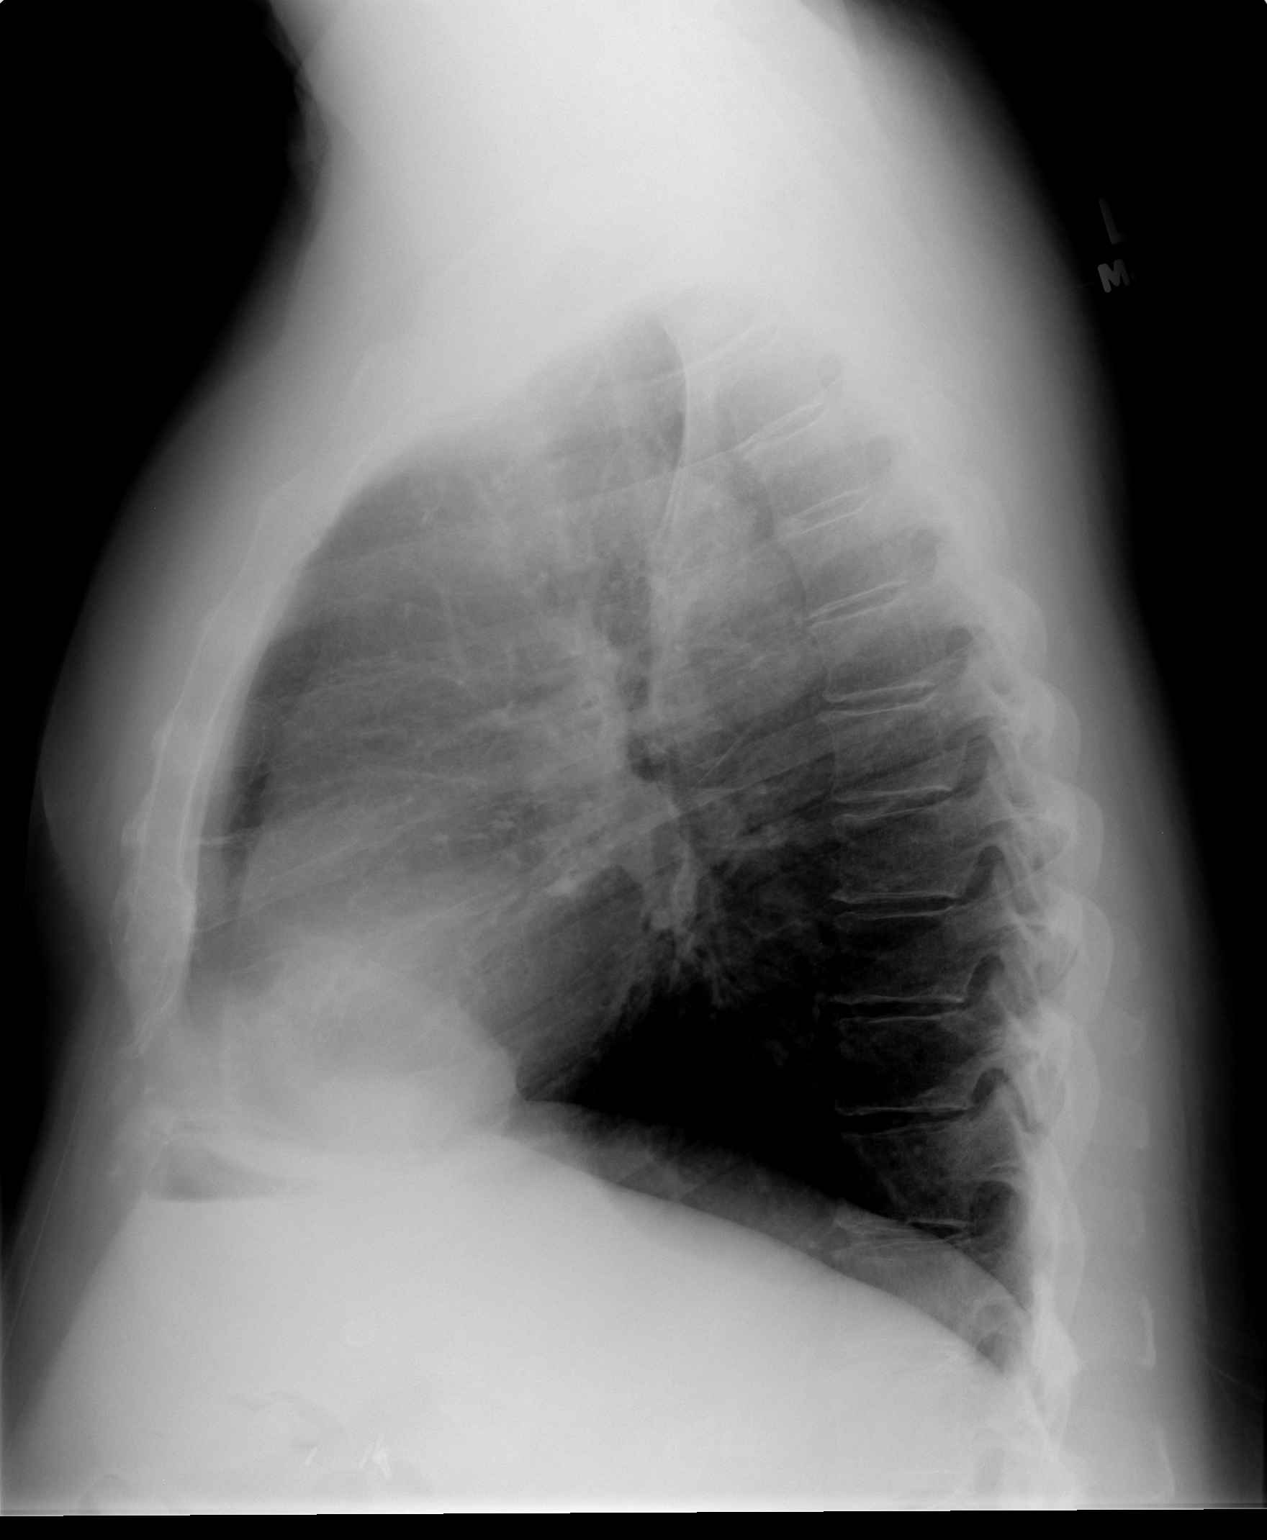

[2 of 2 positions shown; findings below may reference images not displayed]

FINDINGS: Cardiomediastinal silhouette is stable. No acute
infiltrate or pleural effusion.  No pulmonary edema.  Bony thorax
is unremarkable.  Mild hyperinflation again noted.
IMPRESSION: No active disease.  No significant change.  Mild hyperinflation
again noted.

## 2013-06-25 IMAGING — CR DG ANG/EXT/UNI/OR LEFT
1 series · 1 of 1 positions shown · non-contrast
Comparison: none

CLINICAL DATA: Bypass graft

INTRAOPERATIVE ARTERIOGRAM

[AP]
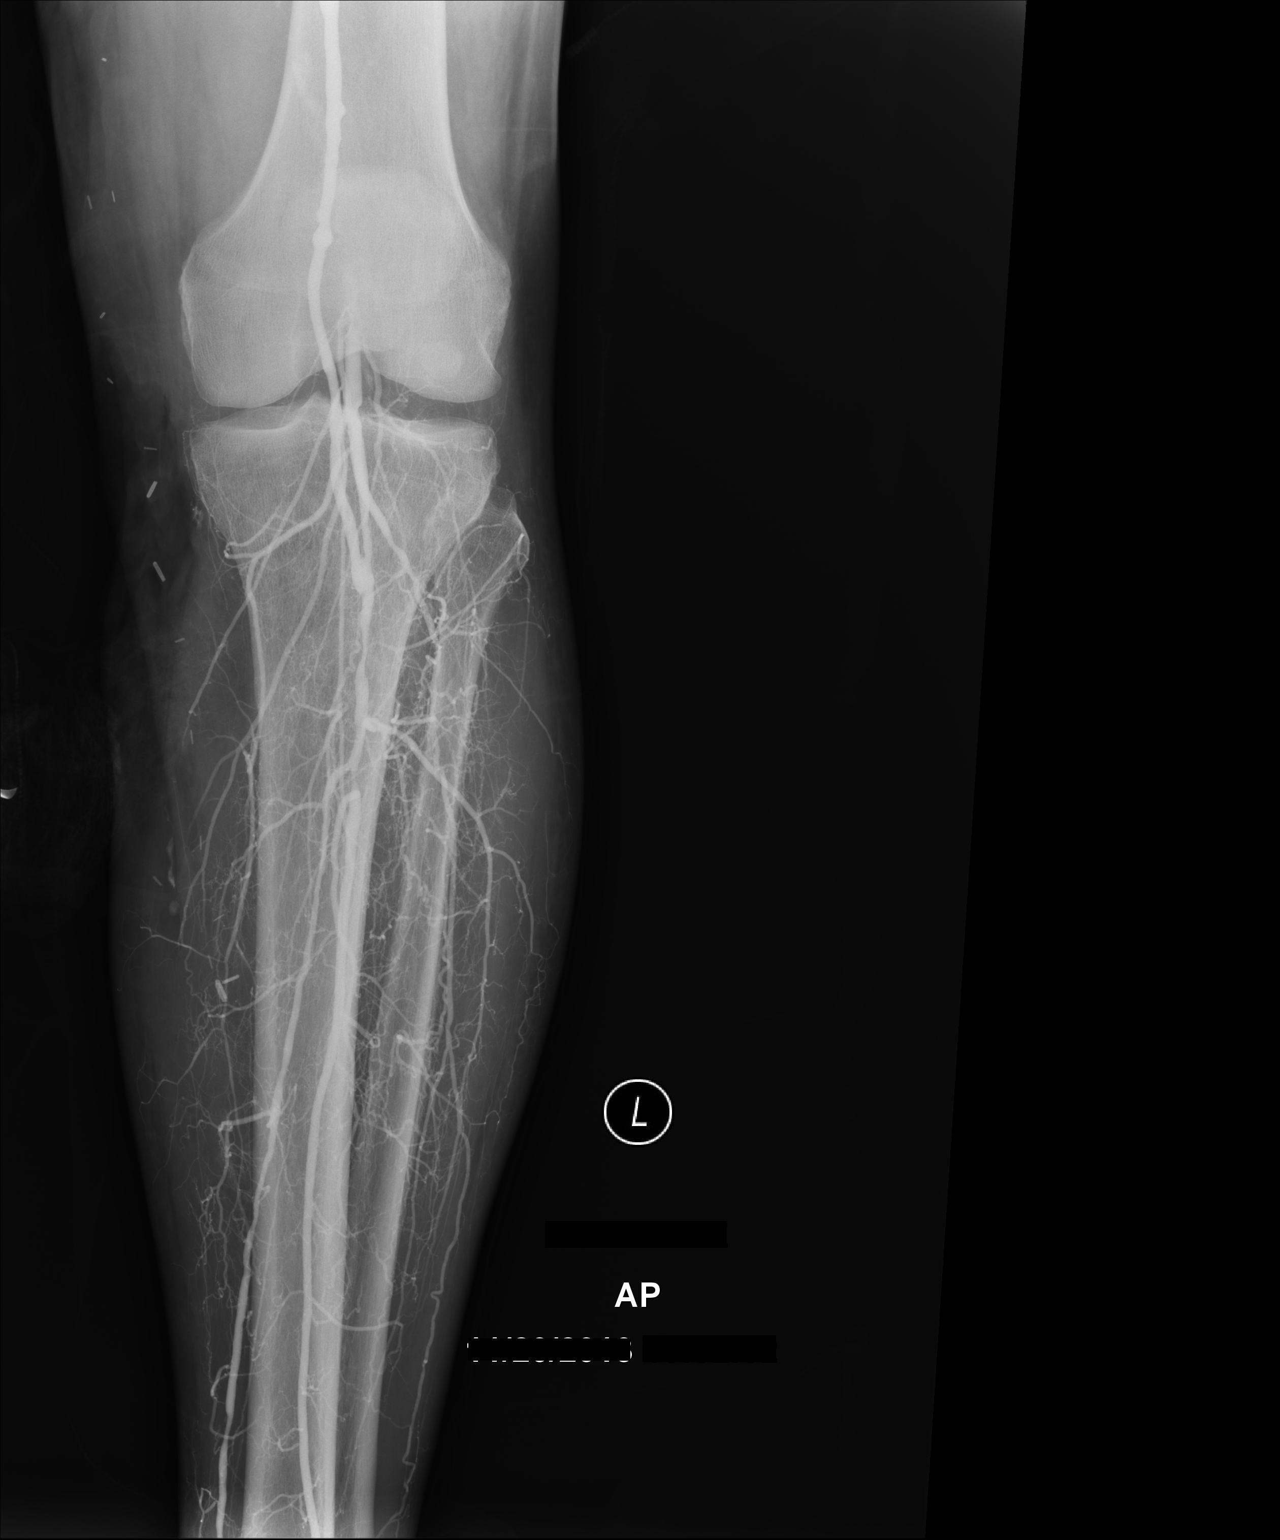

[1 of 1 positions shown; findings below may reference images not displayed]

FINDINGS: No previous for comparison.  There is a vein graft to
the tibioperoneal trunk.  Distal anastomosis appears patent.  There
is contiguous but diseased posterior tibial and peroneal runoff to
the lower calf level, lower margin of the image.  There is some
opacification of the proximal anterior tibial artery, not seen
below the mid calf.
IMPRESSION: 1.  Patent vein graft to the tibioperoneal trunk with diseased two
vessel runoff

## 2014-01-20 ENCOUNTER — Other Ambulatory Visit: Payer: Self-pay | Admitting: Vascular Surgery

## 2014-01-20 DIAGNOSIS — Z48812 Encounter for surgical aftercare following surgery on the circulatory system: Secondary | ICD-10-CM

## 2014-01-20 DIAGNOSIS — I6529 Occlusion and stenosis of unspecified carotid artery: Secondary | ICD-10-CM

## 2014-01-20 DIAGNOSIS — I739 Peripheral vascular disease, unspecified: Secondary | ICD-10-CM

## 2014-05-29 ENCOUNTER — Encounter: Payer: Self-pay | Admitting: Family

## 2014-05-30 ENCOUNTER — Ambulatory Visit (INDEPENDENT_AMBULATORY_CARE_PROVIDER_SITE_OTHER)
Admission: RE | Admit: 2014-05-30 | Discharge: 2014-05-30 | Disposition: A | Discharge status: Home / Self Care | Payer: Medicare HMO | Source: Ambulatory Visit | Attending: Vascular Surgery | Admitting: Vascular Surgery

## 2014-05-30 ENCOUNTER — Ambulatory Visit: Payer: Medicare Other | Admitting: Vascular Surgery

## 2014-05-30 ENCOUNTER — Ambulatory Visit (HOSPITAL_COMMUNITY)
Admission: RE | Admit: 2014-05-30 | Discharge: 2014-05-30 | Disposition: A | Discharge status: Home / Self Care | Payer: Medicare HMO | Source: Ambulatory Visit | Attending: Family | Admitting: Family

## 2014-05-30 ENCOUNTER — Ambulatory Visit (INDEPENDENT_AMBULATORY_CARE_PROVIDER_SITE_OTHER): Payer: Medicare HMO | Admitting: Family

## 2014-05-30 DIAGNOSIS — I739 Peripheral vascular disease, unspecified: Secondary | ICD-10-CM

## 2014-05-30 DIAGNOSIS — Z48812 Encounter for surgical aftercare following surgery on the circulatory system: Secondary | ICD-10-CM

## 2014-05-30 DIAGNOSIS — I6529 Occlusion and stenosis of unspecified carotid artery: Secondary | ICD-10-CM

## 2014-05-31 NOTE — Patient Instructions (Signed)
Dear [name],  Your recent vascular lab study of 05-30-14 indicate: No significant change compared to prior exam of 05-25-13. Please follow up with your lab studies in one year.       Smoking Cessation, Tips for Success If you are ready to quit smoking, congratulations! You have chosen to help yourself be healthier. Cigarettes bring nicotine, tar, carbon monoxide, and other irritants into your body. Your lungs, heart, and blood vessels will be able to work better without these poisons. There are many different ways to quit smoking. Nicotine gum, nicotine patches, a nicotine inhaler, or nicotine nasal spray can help with physical craving. Hypnosis, support groups, and medicines help break the habit of smoking. WHAT THINGS CAN I DO TO MAKE QUITTING EASIER?  Here are some tips to help you quit for good:  Pick a date when you will quit smoking completely. Tell all of your friends and family about your plan to quit on that date.  Do not try to slowly cut down on the number of cigarettes you are smoking. Pick a quit date and quit smoking completely starting on that day.  Throw away all cigarettes.   Clean and remove all ashtrays from your home, work, and car.   On a card, write down your reasons for quitting. Carry the card with you and read it when you get the urge to smoke.   Cleanse your body of nicotine. Drink enough water and fluids to keep your urine clear or pale yellow. Do this after quitting to flush the nicotine from your body.   Learn to predict your moods. Do not let a bad situation be your excuse to have a cigarette. Some situations in your life might tempt you into wanting a cigarette.   Never have "just one" cigarette. It leads to wanting another and another. Remind yourself of your decision to quit.   Change habits associated with smoking. If you smoked while driving or when feeling stressed, try other activities to replace smoking. Stand up when drinking your coffee.  Brush your teeth after eating. Sit in a different chair when you read the paper. Avoid alcohol while trying to quit, and try to drink fewer caffeinated beverages. Alcohol and caffeine may urge you to smoke.   Avoid foods and drinks that can trigger a desire to smoke, such as sugary or spicy foods and alcohol.   Ask people who smoke not to smoke around you.   Have something planned to do right after eating or having a cup of coffee. For example, plan to take a walk or exercise.   Try a relaxation exercise to calm you down and decrease your stress. Remember, you may be tense and nervous for the first 2 weeks after you quit, but this will pass.   Find new activities to keep your hands busy. Play with a pen, coin, or rubber band. Doodle or draw things on paper.   Brush your teeth right after eating. This will help cut down on the craving for the taste of tobacco after meals. You can also try mouthwash.   Use oral substitutes in place of cigarettes. Try using lemon drops, carrots, cinnamon sticks, or chewing gum. Keep them handy so they are available when you have the urge to smoke.   When you have the urge to smoke, try deep breathing.   Designate your home as a nonsmoking area.   If you are a heavy smoker, ask your health care provider about a prescription for nicotine  chewing gum. It can ease your withdrawal from nicotine.   Reward yourself. Set aside the cigarette money you save and buy yourself something nice.   Look for support from others. Join a support group or smoking cessation program. Ask someone at home or at work to help you with your plan to quit smoking.   Always ask yourself, "Do I need this cigarette or is this just a reflex?" Tell yourself, "Today, I choose not to smoke," or "I do not want to smoke." You are reminding yourself of your decision to quit.  Do not replace cigarette smoking with electronic cigarettes (commonly called e-cigarettes). The safety of  e-cigarettes is unknown, and some may contain harmful chemicals.  If you relapse, do not give up! Plan ahead and think about what you will do the next time you get the urge to smoke.  HOW WILL I FEEL WHEN I QUIT SMOKING? You may have symptoms of withdrawal because your body is used to nicotine (the addictive substance in cigarettes). You may crave cigarettes, be irritable, feel very hungry, cough often, get headaches, or have difficulty concentrating. The withdrawal symptoms are only temporary. They are strongest when you first quit but will go away within 10-14 days. When withdrawal symptoms occur, stay in control. Think about your reasons for quitting. Remind yourself that these are signs that your body is healing and getting used to being without cigarettes. Remember that withdrawal symptoms are easier to treat than the major diseases that smoking can cause.  Even after the withdrawal is over, expect periodic urges to smoke. However, these cravings are generally short lived and will go away whether you smoke or not. Do not smoke!  WHAT RESOURCES ARE AVAILABLE TO HELP ME QUIT SMOKING? Your health care provider can direct you to community resources or hospitals for support, which may include:  Group support.  Education.  Hypnosis.  Therapy. Document Released: 08/22/2004 Document Revised: 09/14/2013 Document Reviewed: 05/12/2013 [facility] Patient Information 2015 [place], [place]. This information is not intended to replace advice given to you by your health care provider. Make sure you discuss any questions you have with your health care provider. Peripheral Vascular Disease  Peripheral vascular disease (PVD) is caused by cholesterol buildup in the arteries. The arteries become narrow or clogged. This makes it hard for blood to flow. It happens most in the legs, but it can occur in other areas of your body. HOME CARE   Quit smoking, if you smoke.  Exercise as told by your doctor.  Follow a  low-fat, low-cholesterol diet as told by your doctor.  Control your diabetes, if you have diabetes.  Care for your feet to prevent infection.  Only take medicine as told by your doctor. GET HELP RIGHT AWAY IF:   You have pain or lose feeling (numbness) in your arms or legs.  Your arms or legs turn cold or blue.  You have redness, warmth, and puffiness (swelling) in your arms or legs. MAKE SURE YOU:   Understand these instructions.  Will watch your condition.  Will get help right away if you are not doing well or get worse. Document Released: 02/18/2010 Document Revised: 02/16/2012 Document Reviewed: 02/18/2010 [facility] Patient Information 2015 [place], [place]. This information is not intended to replace advice given to you by your health care provider. Make sure you discuss any questions you have with your health care provider. Stroke Prevention Some medical conditions and behaviors are associated with an increased chance of having a stroke. You may  prevent a stroke by making healthy choices and managing medical conditions. HOW CAN I REDUCE MY RISK OF HAVING A STROKE?   Stay physically active. Get at least 30 minutes of activity on most or all days.  Do not smoke. It may also be helpful to avoid exposure to secondhand smoke.  Limit alcohol use. Moderate alcohol use is considered to be:  No more than 2 drinks per day for men.  No more than 1 drink per day for nonpregnant women.  Eat healthy foods. This involves  Eating 5 or more servings of fruits and vegetables a day.  Following a diet that addresses high blood pressure (hypertension), high cholesterol, diabetes, or obesity.  Manage your cholesterol levels.  A diet low in saturated fat, trans fat, and cholesterol and high in fiber may control cholesterol levels.  Take any prescribed medicines to control cholesterol as directed by your health care provider.  Manage your diabetes.  A controlled-carbohydrate,  controlled-sugar diet is recommended to manage diabetes.  Take any prescribed medicines to control diabetes as directed by your health care provider.  Control your hypertension.  A low-salt (sodium), low-saturated fat, low-trans fat, and low-cholesterol diet is recommended to manage hypertension.  Take any prescribed medicines to control hypertension as directed by your health care provider.  Maintain a healthy weight.  A reduced-calorie, low-sodium, low-saturated fat, low-trans fat, low-cholesterol diet is recommended to manage weight.  Stop drug abuse.  Avoid taking birth control pills.  Talk to your health care provider about the risks of taking birth control pills if you are over 37 years old, smoke, get migraines, or have ever had a blood clot.  Get evaluated for sleep disorders (sleep apnea).  Talk to your health care provider about getting a sleep evaluation if you snore a lot or have excessive sleepiness.  Take medicines as directed by your health care provider.  For some people, aspirin or blood thinners (anticoagulants) are helpful in reducing the risk of forming abnormal blood clots that can lead to stroke. If you have the irregular heart rhythm of atrial fibrillation, you should be on a blood thinner unless there is a good reason you cannot take them.  Understand all your medicine instructions.  Make sure that other other conditions (such as anemia or atherosclerosis) are addressed. SEEK IMMEDIATE MEDICAL CARE IF:   You have sudden weakness or numbness of the face, arm, or leg, especially on one side of the body.  Your face or eyelid droops to one side.  You have sudden confusion.  You have trouble speaking (aphasia) or understanding.  You have sudden trouble seeing in one or both eyes.  You have sudden trouble walking.  You have dizziness.  You have a loss of balance or coordination.  You have a sudden, severe headache with no known cause.  You have new  chest pain or an irregular heartbeat. Any of these symptoms may represent a serious problem that is an emergency. Do not wait to see if the symptoms will go away. Get medical help at once. Call your local emergency services  (911 in [place] Do not drive yourself to the hospital. Document Released: 01/01/2005 Document Revised: 09/14/2013 Document Reviewed: 05/27/2013 [facility] Patient Information 2015 [place], [place]. This information is not intended to replace advice given to you by your health care provider. Make sure you discuss any questions you have with your health care provider.

## 2014-06-01 ENCOUNTER — Encounter: Payer: Self-pay | Admitting: Vascular Surgery

## 2014-06-02 NOTE — Progress Notes (Signed)
Lab only 

## 2014-11-16 ENCOUNTER — Encounter (HOSPITAL_COMMUNITY): Payer: Self-pay | Admitting: Surgery

## 2015-05-24 ENCOUNTER — Other Ambulatory Visit: Payer: Self-pay | Admitting: *Deleted

## 2015-05-24 DIAGNOSIS — I739 Peripheral vascular disease, unspecified: Secondary | ICD-10-CM

## 2015-05-24 DIAGNOSIS — Z48812 Encounter for surgical aftercare following surgery on the circulatory system: Secondary | ICD-10-CM

## 2015-06-05 ENCOUNTER — Encounter (HOSPITAL_COMMUNITY): Payer: Medicare HMO

## 2015-06-05 ENCOUNTER — Ambulatory Visit: Payer: Medicare HMO | Admitting: Family

## 2015-06-05 ENCOUNTER — Other Ambulatory Visit (HOSPITAL_COMMUNITY): Payer: Medicare HMO

## 2015-11-26 ENCOUNTER — Other Ambulatory Visit: Payer: Self-pay | Admitting: *Deleted

## 2015-11-26 DIAGNOSIS — Z48812 Encounter for surgical aftercare following surgery on the circulatory system: Secondary | ICD-10-CM

## 2015-11-26 DIAGNOSIS — I6523 Occlusion and stenosis of bilateral carotid arteries: Secondary | ICD-10-CM

## 2015-11-26 DIAGNOSIS — I739 Peripheral vascular disease, unspecified: Secondary | ICD-10-CM

## 2015-11-28 ENCOUNTER — Encounter: Payer: Self-pay | Admitting: Family

## 2015-12-06 ENCOUNTER — Ambulatory Visit (INDEPENDENT_AMBULATORY_CARE_PROVIDER_SITE_OTHER)
Admission: RE | Admit: 2015-12-06 | Discharge: 2015-12-06 | Disposition: A | Discharge status: Home / Self Care | Payer: Medicare HMO | Source: Ambulatory Visit | Attending: Family | Admitting: Family

## 2015-12-06 ENCOUNTER — Encounter: Payer: Self-pay | Admitting: Family

## 2015-12-06 ENCOUNTER — Ambulatory Visit (HOSPITAL_COMMUNITY)
Admission: RE | Admit: 2015-12-06 | Discharge: 2015-12-06 | Disposition: A | Discharge status: Home / Self Care | Payer: Medicare HMO | Source: Ambulatory Visit | Attending: Family | Admitting: Family

## 2015-12-06 ENCOUNTER — Ambulatory Visit (INDEPENDENT_AMBULATORY_CARE_PROVIDER_SITE_OTHER): Payer: Medicare HMO | Admitting: Family

## 2015-12-06 VITALS — BP 147/77 | HR 56 | Temp 96.8°F | Resp 16 | Ht 68.0 in | Wt 200.0 lb

## 2015-12-06 DIAGNOSIS — I6501 Occlusion and stenosis of right vertebral artery: Secondary | ICD-10-CM | POA: Diagnosis not present

## 2015-12-06 DIAGNOSIS — I739 Peripheral vascular disease, unspecified: Secondary | ICD-10-CM | POA: Diagnosis not present

## 2015-12-06 DIAGNOSIS — Z4889 Encounter for other specified surgical aftercare: Secondary | ICD-10-CM

## 2015-12-06 DIAGNOSIS — Z720 Tobacco use: Secondary | ICD-10-CM

## 2015-12-06 DIAGNOSIS — Z48812 Encounter for surgical aftercare following surgery on the circulatory system: Secondary | ICD-10-CM

## 2015-12-06 DIAGNOSIS — I779 Disorder of arteries and arterioles, unspecified: Secondary | ICD-10-CM

## 2015-12-06 DIAGNOSIS — Z9889 Other specified postprocedural states: Secondary | ICD-10-CM

## 2015-12-06 DIAGNOSIS — E785 Hyperlipidemia, unspecified: Secondary | ICD-10-CM | POA: Diagnosis not present

## 2015-12-06 DIAGNOSIS — I6523 Occlusion and stenosis of bilateral carotid arteries: Secondary | ICD-10-CM | POA: Diagnosis not present

## 2015-12-06 DIAGNOSIS — I10 Essential (primary) hypertension: Secondary | ICD-10-CM | POA: Diagnosis not present

## 2015-12-06 DIAGNOSIS — F17200 Nicotine dependence, unspecified, uncomplicated: Secondary | ICD-10-CM

## 2015-12-06 DIAGNOSIS — Z95828 Presence of other vascular implants and grafts: Secondary | ICD-10-CM

## 2015-12-06 DIAGNOSIS — I708 Atherosclerosis of other arteries: Secondary | ICD-10-CM | POA: Diagnosis not present

## 2015-12-06 DIAGNOSIS — I771 Stricture of artery: Secondary | ICD-10-CM

## 2015-12-06 NOTE — Progress Notes (Signed)
[facility] [place] HISTORY AND PHYSICAL   MRN : [id number]  History of Present Illness:   [name] is a 81 y.o. male patient of Dr. Kellie Simmering who is s/p left femoral-popliteal bypass graft with saphenous vein performed in November 2013.  He is also s/p bilateral external iliac artery, common femoral artery, superficial femoral artery and profunda endarterectomies 03/17/2007.  He is also s/p right CEA at an outside facility in 2003 and left CEA in 2006.  He returns today for follow up.   Claudication symptoms are essentially relieved in the left leg. He is limited by right anterior thigh claudication but probably more limited by dyspnea on exertion. States swelling in the left leg has improved and the ulcers have healed. He denies rest pain in his legs.  He reports a TIA in the late 1980's as manifested by transient expressive aphasia; he denies hemiparesis, monocular loss of vision, and unilateral facial drooping. He denies any subsequent stroke or TIA sx's.  He denies steal sx's in either hand/arm.   Pt Diabetic: No Pt smoker: smoker  (1-1.5 ppd, started at age 81 yrs)  Pt meds include: Statin :Yes Betablocker: Yes ASA: Yes Other anticoagulants/antiplatelets: no   Current Outpatient Prescriptions  Medication Sig Dispense Refill  . aspirin 325 MG tablet Take 325 mg by mouth daily.    . cloNIDine (CATAPRES) 0.2 MG tablet     . clotrimazole-betamethasone (LOTRISONE) cream     . furosemide (LASIX) 40 MG tablet     . isosorbide mononitrate (IMDUR) 30 MG 24 hr tablet     . labetalol (NORMODYNE) 100 MG tablet     . levothyroxine (SYNTHROID, LEVOTHROID) 75 MCG tablet Take 75 mcg by mouth daily.    . pravastatin (PRAVACHOL) 40 MG tablet Take 40 mg by mouth daily.    . VENTOLIN HFA 108 (90 Base) MCG/ACT inhaler     . doxazosin (CARDURA) 2 MG tablet Take 2 mg by mouth at bedtime. Reported on 12/06/2015    . fluocinonide ointment (LIDEX) 0.05 % Apply  topically 2 (two) times daily. Reported on 99991111    . folic acid (FOLVITE) A999333 MCG tablet Take 400 mcg by mouth daily. Reported on 12/06/2015    . losartan (COZAAR) 100 MG tablet Take 100 mg by mouth daily. Reported on 12/06/2015    . methotrexate (RHEUMATREX) 2.5 MG tablet Take 12.5 mg by mouth once a week. Reported on 12/06/2015    . naproxen sodium (ANAPROX) 220 MG tablet Take 220 mg by mouth at bedtime as needed. Reported on 12/06/2015    . oxyCODONE (OXY IR/ROXICODONE) 5 MG immediate release tablet Take 1-2 tablets (5-10 mg total) by mouth every 4 (four) hours as needed. (Patient not taking: Reported on 12/06/2015) 30 tablet 0   No current facility-administered medications for this visit.    Past Medical History  Diagnosis Date  . Hyperlipidemia   . Peripheral vascular disease ([place])   . Cancer [facility]) 2011    Right kidney   . SOB (shortness of breath) on exertion   . Stroke [facility])     TIA  . Psoriasis   . Coronary artery disease   . COPD (chronic obstructive pulmonary disease) ([place])   . Asthma   . GERD (gastroesophageal reflux disease)   . Hypertension     takes meds daily  . Hypothyroidism     Social History Social History  Substance Use Topics  . Smoking status: Current Every Day Smoker -- 1.50  packs/day    Types: Cigarettes  . Smokeless tobacco: Never Used  . Alcohol Use: No    Family History History reviewed. No pertinent family history.  Surgical History Past Surgical History  Procedure Laterality Date  . Nephrectomy  2011    [facility]-     Right nephrectomy  . Prostatectomy    . Carotid endarterectomy  2003    Right CEA  done in [place], [place]  . Carotid endarterectomy  2006    Left CEA  . Iliac endarterectomies       Bilateral w/ Common femoral endarterectomy  . Vascular surgery    . Cholecystectomy    . Lower extremity angiogram    . Femoral-popliteal bypass graft  10/27/2012    Procedure: BYPASS GRAFT FEMORAL-POPLITEAL ARTERY;  Surgeon: Mal Misty, MD;  Location: [place];  Service: Vascular;  Laterality: Left;  Vein graft  . Abdominal aortagram N/A 10/05/2012    Procedure: ABDOMINAL AORTAGRAM;  Surgeon: Serafina Mitchell, MD;  Location: [facility] CATH LAB;  Service: Cardiovascular;  Laterality: N/A;  . Lower extremity angiogram Left 10/05/2012    Procedure: LOWER EXTREMITY ANGIOGRAM;  Surgeon: Serafina Mitchell, MD;  Location: [facility] CATH LAB;  Service: Cardiovascular;  Laterality: Left;  lt leg angio    Allergies  Allergen Reactions  . Penicillins Rash    Current Outpatient Prescriptions  Medication Sig Dispense Refill  . aspirin 325 MG tablet Take 325 mg by mouth daily.    . cloNIDine (CATAPRES) 0.2 MG tablet     . clotrimazole-betamethasone (LOTRISONE) cream     . furosemide (LASIX) 40 MG tablet     . isosorbide mononitrate (IMDUR) 30 MG 24 hr tablet     . labetalol (NORMODYNE) 100 MG tablet     . levothyroxine (SYNTHROID, LEVOTHROID) 75 MCG tablet Take 75 mcg by mouth daily.    . pravastatin (PRAVACHOL) 40 MG tablet Take 40 mg by mouth daily.    . VENTOLIN HFA 108 (90 Base) MCG/ACT inhaler     . doxazosin (CARDURA) 2 MG tablet Take 2 mg by mouth at bedtime. Reported on 12/06/2015    . fluocinonide ointment (LIDEX) 0.05 % Apply topically 2 (two) times daily. Reported on 99991111    . folic acid (FOLVITE) A999333 MCG tablet Take 400 mcg by mouth daily. Reported on 12/06/2015    . losartan (COZAAR) 100 MG tablet Take 100 mg by mouth daily. Reported on 12/06/2015    . methotrexate (RHEUMATREX) 2.5 MG tablet Take 12.5 mg by mouth once a week. Reported on 12/06/2015    . naproxen sodium (ANAPROX) 220 MG tablet Take 220 mg by mouth at bedtime as needed. Reported on 12/06/2015    . oxyCODONE (OXY IR/ROXICODONE) 5 MG immediate release tablet Take 1-2 tablets (5-10 mg total) by mouth every 4 (four) hours as needed. (Patient not taking: Reported on 12/06/2015) 30 tablet 0   No current facility-administered medications for this visit.      REVIEW OF SYSTEMS: See HPI for pertinent positives and negatives.  Physical Examination Filed Vitals:   12/06/15 1326 12/06/15 1329 12/06/15 1331  BP: 174/85  184/80  Pulse: 61  57  Temp:   96.8 F (36 C)  TempSrc:   Oral  Resp:   16  Height:  5\' 8"  (1.727 m) 5\' 8"  (1.727 m)  Weight:  200 lb (90.719 kg) 200 lb (90.719 kg)  SpO2:   92%   Body mass index is 30.42 kg/(m^2).  General:  WDWN,  obese male in NAD Gait: Normal HENT: WNL Eyes: Pupils equal Pulmonary: slightly labored breathing at rest (pursed lips expirations), no rales, rhonchi, or wheezing. Limited air movement in all fields.  Cardiac: RRR, no murmur detected  Abdomen: soft, NT, no masses palpated Skin: no rashes, no ulcers, no cellulitis.   VASCULAR EXAM  Carotid Bruits Right Left   Positive Negative           Aorta is not palpable Radial pulses: right is 1+, left is 2+ palpable                  VASCULAR EXAM: Extremities without ischemic changes, without Gangrene; without open wounds.                                                                                                          LE Pulses Right Left       FEMORAL  1+ palpable  2+ palpable        POPLITEAL  not palpable   not palpable       POSTERIOR TIBIAL  not palpable   not palpable        DORSALIS PEDIS      ANTERIOR TIBIAL not palpable  not palpable      Musculoskeletal: no muscle wasting or atrophy; no peripheral edema  Neurologic: A&O X 3; Appropriate Affect ;  SENSATION: normal; MOTOR FUNCTION: 5/5 Symmetric, CN 2-12 intact Speech is fluent/normal     Non-Invasive Vascular Imaging (12/06/2015):   LOWER EXTREMITY ARTERIAL DUPLEX EVALUATION    INDICATION: Peripheral vascular disease     PREVIOUS INTERVENTION(S): Bilateral external iliac artery, common femoral artery, superficial femoral artery and profunda endarterectomies 03/17/2007. Left femoropopliteal arterial bypass graft 10/2012    DUPLEX EXAM:     RIGHT   LEFT   Peak Systolic Velocity (cm/s) Ratio (if abnormal) Waveform  Peak Systolic Velocity (cm/s) Ratio (if abnormal) Waveform     Inflow Artery 75  M      Proximal Anastomosis 58  M     Proximal Graft 35  M     Mid Graft 58  M      Distal Graft 66  M     Distal Anastomosis 64  M     Outflow Artery 43  M  0.60 Today's ABI / TBI 0.69  0.49 Previous ABI / TBI (05/30/2014  ) 0.68    Waveform:    M - Monophasic       B - Biphasic       T - Triphasic  If Ankle Brachial Index (ABI) or Toe Brachial Index (TBI) performed, please see complete report     ADDITIONAL FINDINGS: Impression noted on page two of this report.     IMPRESSION: Moderate diffuse disease is observed in the native inflow artery with turbulent waveforms noted in the external iliac artery. Patent graft that courses deep and posteriorly in the thigh; portions are difficult to visualize.    Compared to the previous exam:  Right ABI has slightly declined, the left ABI remains  stable.     Carotid Duplex: Patent bilateral CEA sites with evidence of mild hyperplasia in the left proximal patch site. Right subclavian artery velocity of 423 cm/s. No significant change in comparison to the last exam on 05/30/14.   ASSESSMENT:  [name] is a 81 y.o. male who is s/p left femoral-popliteal bypass graft with saphenous vein performed in November 2013.  He is also s/p bilateral external iliac artery, common femoral artery, superficial femoral artery and profunda endarterectomies 03/17/2007. He is also s/p right CEA at an outside facility in 2003 and left CEA in 2006. Claudication symptoms are essentially relieved in the left leg. He is limited by right anterior thigh claudication but probably more limited by dyspnea on exertion.  Swelling in the left leg has improved and the ulcers have healed. He has no rest pain in his legs.  He reports a TIA in the late 1980's as manifested by transient expressive aphasia, no subsequent stroke  or TIA sx's.  He has no steal sx's in either hand/arm.  Today's carotid duplex suggests patent bilateral CEA sites with evidence of mild hyperplasia in the left proximal patch site. Right subclavian artery velocity of 423 cm/s. No significant change in comparison to the last exam on 05/30/14.  Today's left LE arterial duplex suggests moderate diffuse disease in the native inflow artery with turbulent waveforms noted in the external iliac artery. Patent graft that courses deep and posteriorly in the thigh; portions are difficult to visualize. Right ABI has improved slightly and left remains stable; both legs with moderate arterial occlusive disease. All monophasic waveforms. TBI's are 0.44 (R) and 0.40 (L).  He has no signs of ischemia in his feet/legs.  Fortunately he does not have DM, but unfortunately he continues to smoke 1-1.5 ppd.  His other atherosclerotic risk factors include CAD, COPD, and obesity.   Face to face time with patient was 25 minutes. Over 50% of this time was spent on counseling and coordination of care.   PLAN:   The patient was counseled re smoking cessation and given several free resources re smoking cessation.  Graduated walking program discussed in detail.  Based on today's exam and non-invasive vascular lab results, the patient will follow up in 1 year with the following tests: ABI's, left LE arterial duplex, and carotid duplex. I advised him to notify us should he develop concerns re the circulation in his feet/legs I discussed in depth with the patient the nature of atherosclerosis, and emphasized the importance of maximal medical management including strict control of blood pressure, blood glucose, and lipid levels, obtaining regular exercise, and cessation of smoking.  The patient is aware that without maximal medical management the underlying atherosclerotic disease process will progress, limiting the benefit of any interventions.  The patient was given  information about stroke prevention and what symptoms should prompt the patient to seek immediate medical care.  The patient was given information about PAD including signs, symptoms, treatment, what symptoms should prompt the patient to seek immediate medical care, and risk reduction measures to take. Thank you for allowing [place] to participate in this patient's care.  Clemon Chambers, RN, MSN, FNP-C [facility] Office: [phone]  Clinic MD: Trula Slade  12/06/2015 1:39 PM

## 2015-12-06 NOTE — Progress Notes (Signed)
Filed Vitals:   12/06/15 1329 12/06/15 1331 12/06/15 1339 12/06/15 1341  BP:  184/80 157/79 147/77  Pulse:  57 55 56  Temp:  96.8 F (36 C)    TempSrc:  Oral    Resp:  16    Height: 5\' 8"  (1.727 m) 5\' 8"  (1.727 m)    Weight: 200 lb (90.719 kg) 200 lb (90.719 kg)    SpO2:  92%

## 2015-12-06 NOTE — Patient Instructions (Addendum)
Peripheral Vascular Disease Peripheral vascular disease (PVD) is a disease of the blood vessels that are not part of your heart and brain. A simple term for PVD is poor circulation. In most cases, PVD narrows the blood vessels that carry blood from your heart to the rest of your body. This can result in a decreased supply of blood to your arms, legs, and internal organs, like your stomach or kidneys. However, it most often affects a person's lower legs and feet. There are two types of PVD.  Organic PVD. This is the more common type. It is caused by damage to the structure of blood vessels.  Functional PVD. This is caused by conditions that make blood vessels contract and tighten (spasm). Without treatment, PVD tends to get worse over time. PVD can also lead to acute ischemic limb. This is when an arm or limb suddenly has trouble getting enough blood. This is a medical emergency. CAUSES Each type of PVD has many different causes. The most common cause of PVD is buildup of a fatty material (plaque) inside of your arteries (atherosclerosis). Small amounts of plaque can break off from the walls of the blood vessels and become lodged in a smaller artery. This blocks blood flow and can cause acute ischemic limb. Other common causes of PVD include:  Blood clots that form inside of blood vessels.  Injuries to blood vessels.  Diseases that cause inflammation of blood vessels or cause blood vessel spasms.  Health behaviors and health history that increase your risk of developing PVD. RISK FACTORS  You may have a greater risk of PVD if you:  Have a family history of PVD.  Have certain medical conditions, including:  High cholesterol.  Diabetes.  High blood pressure (hypertension).  Coronary heart disease.  Past problems with blood clots.  Past injury, such as burns or a broken bone. These may have damaged blood vessels in your limbs.  Buerger disease. This is caused by inflamed blood  vessels in your hands and feet.  Some forms of arthritis.  Rare birth defects that affect the arteries in your legs.  Use tobacco.  Do not get enough exercise.  Are obese.  Are age 50 or older. SIGNS AND SYMPTOMS  PVD may cause many different symptoms. Your symptoms depend on what part of your body is not getting enough blood. Some common signs and symptoms include:  Cramps in your lower legs. This may be a symptom of poor leg circulation (claudication).  Pain and weakness in your legs while you are physically active that goes away when you rest (intermittent claudication).  Leg pain when at rest.  Leg numbness, tingling, or weakness.  Coldness in a leg or foot, especially when compared with the other leg.  Skin or hair changes. These can include:  Hair loss.  Shiny skin.  Pale or bluish skin.  Thick toenails.  Inability to get or maintain an erection (erectile dysfunction). People with PVD are more prone to developing ulcers and sores on their toes, feet, or legs. These may take longer than normal to heal. DIAGNOSIS Your health care provider may diagnose PVD from your signs and symptoms. The health care provider will also do a physical exam. You may have tests to find out what is causing your PVD and determine its severity. Tests may include:  Blood pressure recordings from your arms and legs and measurements of the strength of your pulses (pulse volume recordings).  Imaging studies using sound waves to take pictures of   the blood flow through your blood vessels (Doppler ultrasound).  Injecting a dye into your blood vessels before having imaging studies using:  X-rays (angiogram or arteriogram).  Computer-generated X-rays (CT angiogram).  A powerful electromagnetic field and a computer (magnetic resonance angiogram or MRA). TREATMENT Treatment for PVD depends on the cause of your condition and the severity of your symptoms. It also depends on your age. Underlying  causes need to be treated and controlled. These include long-lasting (chronic) conditions, such as diabetes, high cholesterol, and high blood pressure. You may need to first try making lifestyle changes and taking medicines. Surgery may be needed if these do not work. Lifestyle changes may include:  Quitting smoking.  Exercising regularly.  Following a low-fat, low-cholesterol diet. Medicines may include:  Blood thinners to prevent blood clots.  Medicines to improve blood flow.  Medicines to improve your blood cholesterol levels. Surgical procedures may include:  A procedure that uses an inflated balloon to open a blocked artery and improve blood flow (angioplasty).  A procedure to put in a tube (stent) to keep a blocked artery open (stent implant).  Surgery to reroute blood flow around a blocked artery (peripheral bypass surgery).  Surgery to remove dead tissue from an infected wound on the affected limb.  Amputation. This is surgical removal of the affected limb. This may be necessary in cases of acute ischemic limb that are not improved through medical or surgical treatments. HOME CARE INSTRUCTIONS  Take medicines only as directed by your health care provider.  Do not use any tobacco products, including cigarettes, chewing tobacco, or electronic cigarettes. If you need help quitting, ask your health care provider.  Lose weight if you are overweight, and maintain a healthy weight as directed by your health care provider.  Eat a diet that is low in fat and cholesterol. If you need help, ask your health care provider.  Exercise regularly. Ask your health care provider to suggest some good activities for you.  Use compression stockings or other mechanical devices as directed by your health care provider.  Take good care of your feet.  Wear comfortable shoes that fit well.  Check your feet often for any cuts or sores. SEEK MEDICAL CARE IF:  You have cramps in your legs  while walking.  You have leg pain when you are at rest.  You have coldness in a leg or foot.  Your skin changes.  You have erectile dysfunction.  You have cuts or sores on your feet that are not healing. SEEK IMMEDIATE MEDICAL CARE IF:  Your arm or leg turns cold and blue.  Your arms or legs become red, warm, swollen, painful, or numb.  You have chest pain or trouble breathing.  You suddenly have weakness in your face, arm, or leg.  You become very confused or lose the ability to speak.  You suddenly have a very bad headache or lose your vision.   This information is not intended to replace advice given to you by your health care provider. Make sure you discuss any questions you have with your health care provider.   Document Released: 01/01/2005 Document Revised: 12/15/2014 Document Reviewed: 05/04/2014 Elsevier Interactive Patient Education 2016 Else[employer] Inc[employer]    Stroke Prevention Some medical conditions and behaviors are associated with an increased chance of having a stroke. You may prevent a stroke by making healthy choices and managing medical conditions. HOW CAN I REDUCE MY RISK OF HAVING A STROKE?   Stay physically active. Get at   least 30 minutes of activity on most or all days.  Do not smoke. It may also be helpful to avoid exposure to secondhand smoke.  Limit alcohol use. Moderate alcohol use is considered to be:  No more than 2 drinks per day for men.  No more than 1 drink per day for nonpregnant women.  Eat healthy foods. This involves:  Eating 5 or more servings of fruits and vegetables a day.  Making dietary changes that address high blood pressure (hypertension), high cholesterol, diabetes, or obesity.  Manage your cholesterol levels.  Making food choices that are high in fiber and low in saturated fat, trans fat, and cholesterol may control cholesterol levels.  Take any prescribed medicines to control cholesterol as directed by your health care  provider.  Manage your diabetes.  Controlling your carbohydrate and sugar intake is recommended to manage diabetes.  Take any prescribed medicines to control diabetes as directed by your health care provider.  Control your hypertension.  Making food choices that are low in salt (sodium), saturated fat, trans fat, and cholesterol is recommended to manage hypertension.  Ask your health care provider if you need treatment to lower your blood pressure. Take any prescribed medicines to control hypertension as directed by your health care provider.  If you are 18-39 years of age, have your blood pressure checked every 3-5 years. If you are 40 years of age or older, have your blood pressure checked every year.  Maintain a healthy weight.  Reducing calorie intake and making food choices that are low in sodium, saturated fat, trans fat, and cholesterol are recommended to manage weight.  Stop drug abuse.  Avoid taking birth control pills.  Talk to your health care provider about the risks of taking birth control pills if you are over 35 years old, smoke, get migraines, or have ever had a blood clot.  Get evaluated for sleep disorders (sleep apnea).  Talk to your health care provider about getting a sleep evaluation if you snore a lot or have excessive sleepiness.  Take medicines only as directed by your health care provider.  For some people, aspirin or blood thinners (anticoagulants) are helpful in reducing the risk of forming abnormal blood clots that can lead to stroke. If you have the irregular heart rhythm of atrial fibrillation, you should be on a blood thinner unless there is a good reason you cannot take them.  Understand all your medicine instructions.  Make sure that other conditions (such as anemia or atherosclerosis) are addressed. SEEK IMMEDIATE MEDICAL CARE IF:   You have sudden weakness or numbness of the face, arm, or leg, especially on one side of the body.  Your face  or eyelid droops to one side.  You have sudden confusion.  You have trouble speaking (aphasia) or understanding.  You have sudden trouble seeing in one or both eyes.  You have sudden trouble walking.  You have dizziness.  You have a loss of balance or coordination.  You have a sudden, severe headache with no known cause.  You have new chest pain or an irregular heartbeat. Any of these symptoms may represent a serious problem that is an emergency. Do not wait to see if the symptoms will go away. Get medical help at once. Call your local emergency services (911 in [place] Do not drive yourself to the hospital.   This information is not intended to replace advice given to you by your health care provider. Make sure you discuss any questions   you have with your health care provider.   Document Released: 01/01/2005 Document Revised: 12/15/2014 Document Reviewed: 05/27/2013 Elsevier Interactive Patient Education 2016 [employer].    Smoking Cessation, Tips for Success If you are ready to quit smoking, congratulations! You have chosen to help yourself be healthier. Cigarettes bring nicotine, tar, carbon monoxide, and other irritants into your body. Your lungs, heart, and blood vessels will be able to work better without these poisons. There are many different ways to quit smoking. Nicotine gum, nicotine patches, a nicotine inhaler, or nicotine nasal spray can help with physical craving. Hypnosis, support groups, and medicines help break the habit of smoking. WHAT THINGS CAN I DO TO MAKE QUITTING EASIER?  Here are some tips to help you quit for good:  Pick a date when you will quit smoking completely. Tell all of your friends and family about your plan to quit on that date.  Do not try to slowly cut down on the number of cigarettes you are smoking. Pick a quit date and quit smoking completely starting on that day.  Throw away all cigarettes.   Clean and remove all ashtrays from your  home, work, and car.  On a card, write down your reasons for quitting. Carry the card with you and read it when you get the urge to smoke.  Cleanse your body of nicotine. Drink enough water and fluids to keep your urine clear or pale yellow. Do this after quitting to flush the nicotine from your body.  Learn to predict your moods. Do not let a bad situation be your excuse to have a cigarette. Some situations in your life might tempt you into wanting a cigarette.  Never have "just one" cigarette. It leads to wanting another and another. Remind yourself of your decision to quit.  Change habits associated with smoking. If you smoked while driving or when feeling stressed, try other activities to replace smoking. Stand up when drinking your coffee. Brush your teeth after eating. Sit in a different chair when you read the paper. Avoid alcohol while trying to quit, and try to drink fewer caffeinated beverages. Alcohol and caffeine may urge you to smoke.  Avoid foods and drinks that can trigger a desire to smoke, such as sugary or spicy foods and alcohol.  Ask people who smoke not to smoke around you.  Have something planned to do right after eating or having a cup of coffee. For example, plan to take a walk or exercise.  Try a relaxation exercise to calm you down and decrease your stress. Remember, you may be tense and nervous for the first 2 weeks after you quit, but this will pass.  Find new activities to keep your hands busy. Play with a pen, coin, or rubber band. Doodle or draw things on paper.  Brush your teeth right after eating. This will help cut down on the craving for the taste of tobacco after meals. You can also try mouthwash.   Use oral substitutes in place of cigarettes. Try using lemon drops, carrots, cinnamon sticks, or chewing gum. Keep them handy so they are available when you have the urge to smoke.  When you have the urge to smoke, try deep breathing.  Designate your home  as a nonsmoking area.  If you are a heavy smoker, ask your health care provider about a prescription for nicotine chewing gum. It can ease your withdrawal from nicotine.  Reward yourself. Set aside the cigarette money you save and buy yourself  something nice.  Look for support from others. Join a support group or smoking cessation program. Ask someone at home or at work to help you with your plan to quit smoking.  Always ask yourself, "Do I need this cigarette or is this just a reflex?" Tell yourself, "Today, I choose not to smoke," or "I do not want to smoke." You are reminding yourself of your decision to quit.  Do not replace cigarette smoking with electronic cigarettes (commonly called e-cigarettes). The safety of e-cigarettes is unknown, and some may contain harmful chemicals.  If you relapse, do not give up! Plan ahead and think about what you will do the next time you get the urge to smoke. HOW WILL I FEEL WHEN I QUIT SMOKING? You may have symptoms of withdrawal because your body is used to nicotine (the addictive substance in cigarettes). You may crave cigarettes, be irritable, feel very hungry, cough often, get headaches, or have difficulty concentrating. The withdrawal symptoms are only temporary. They are strongest when you first quit but will go away within 10-14 days. When withdrawal symptoms occur, stay in control. Think about your reasons for quitting. Remind yourself that these are signs that your body is healing and getting used to being without cigarettes. Remember that withdrawal symptoms are easier to treat than the major diseases that smoking can cause.  Even after the withdrawal is over, expect periodic urges to smoke. However, these cravings are generally short lived and will go away whether you smoke or not. Do not smoke! WHAT RESOURCES ARE AVAILABLE TO HELP ME QUIT SMOKING? Your health care provider can direct you to community resources or hospitals for support, which may  include:  Group support.  Education.  Hypnosis.  Therapy.   This information is not intended to replace advice given to you by your health care provider. Make sure you discuss any questions you have with your health care provider.   Document Released: 08/22/2004 Document Revised: 12/15/2014 Document Reviewed: 05/12/2013 Elsevier Interactive Patient Education 2016 [employer].     Steps to Quit Smoking  Smoking tobacco can be harmful to your health and can affect almost every organ in your body. Smoking puts you, and those around you, at risk for developing many serious chronic diseases. Quitting smoking is difficult, but it is one of the best things that you can do for your health. It is never too late to quit. WHAT ARE THE BENEFITS OF QUITTING SMOKING? When you quit smoking, you lower your risk of developing serious diseases and conditions, such as:  Lung cancer or lung disease, such as COPD.  Heart disease.  Stroke.  Heart attack.  Infertility.  Osteoporosis and bone fractures. Additionally, symptoms such as coughing, wheezing, and shortness of breath may get better when you quit. You may also find that you get sick less often because your body is stronger at fighting off colds and infections. If you are pregnant, quitting smoking can help to reduce your chances of having a baby of low birth weight. HOW DO I GET READY TO QUIT? When you decide to quit smoking, create a plan to make sure that you are successful. Before you quit:  Pick a date to quit. Set a date within the next two weeks to give you time to prepare.  Write down the reasons why you are quitting. Keep this list in places where you will see it often, such as on your bathroom mirror or in your car or wallet.  Identify the people,  places, things, and activities that make you want to smoke (triggers) and avoid them. Make sure to take these actions:  Throw away all cigarettes at home, at work, and in your  car.  Throw away smoking accessories, such as Scientist, research (medical).  Clean your car and make sure to empty the ashtray.  Clean your home, including curtains and carpets.  Tell your family, friends, and coworkers that you are quitting. Support from your loved ones can make quitting easier.  Talk with your health care provider about your options for quitting smoking.  Find out what treatment options are covered by your health insurance. WHAT STRATEGIES CAN I USE TO QUIT SMOKING?  Talk with your healthcare provider about different strategies to quit smoking. Some strategies include:  Quitting smoking altogether instead of gradually lessening how much you smoke over a period of time. Research shows that quitting "cold [place]" is more successful than gradually quitting.  Attending in-person counseling to help you build problem-solving skills. You are more likely to have success in quitting if you attend several counseling sessions. Even short sessions of 10 minutes can be effective.  Finding resources and support systems that can help you to quit smoking and remain smoke-free after you quit. These resources are most helpful when you use them often. They can include:  Online chats with a Social worker.  Telephone quitlines.  Printed Furniture conservator/restorer.  Support groups or group counseling.  Text messaging programs.  Mobile phone applications.  Taking medicines to help you quit smoking. (If you are pregnant or breastfeeding, talk with your health care provider first.) Some medicines contain nicotine and some do not. Both types of medicines help with cravings, but the medicines that include nicotine help to relieve withdrawal symptoms. Your health care provider may recommend:  Nicotine patches, gum, or lozenges.  Nicotine inhalers or sprays.  Non-nicotine medicine that is taken by mouth. Talk with your health care provider about combining strategies, such as taking medicines while you  are also receiving in-person counseling. Using these two strategies together makes you more likely to succeed in quitting than if you used either strategy on its own. If you are pregnant or breastfeeding, talk with your health care provider about finding counseling or other support strategies to quit smoking. Do not take medicine to help you quit smoking unless told to do so by your health care provider. WHAT THINGS CAN I DO TO MAKE IT EASIER TO QUIT? Quitting smoking might feel overwhelming at first, but there is a lot that you can do to make it easier. Take these important actions:  Reach out to your family and friends and ask that they support and encourage you during this time. Call telephone quitlines, reach out to support groups, or work with a counselor for support.  Ask people who smoke to avoid smoking around you.  Avoid places that trigger you to smoke, such as bars, parties, or smoke-break areas at work.  Spend time around people who do not smoke.  Lessen stress in your life, because stress can be a smoking trigger for some people. To lessen stress, try:  Exercising regularly.  Deep-breathing exercises.  Yoga.  Meditating.  Performing a body scan. This involves closing your eyes, scanning your body from head to toe, and noticing which parts of your body are particularly tense. Purposefully relax the muscles in those areas.  Download or purchase mobile phone or tablet apps (applications) that can help you stick to your quit plan by providing reminders,  tips, and encouragement. There are many free apps, such as QuitGuide from the [employer] Office manager for Disease Control and Prevention). You can find other support for quitting smoking (smoking cessation) through smokefree.gov and other websites. HOW WILL I FEEL WHEN I QUIT SMOKING? Within the first 24 hours of quitting smoking, you may start to feel some withdrawal symptoms. These symptoms are usually most noticeable 2-3 days after  quitting, but they usually do not last beyond 2-3 weeks. Changes or symptoms that you might experience include:  Mood swings.  Restlessness, anxiety, or irritation.  Difficulty concentrating.  Dizziness.  Strong cravings for sugary foods in addition to nicotine.  Mild weight gain.  Constipation.  Nausea.  Coughing or a sore throat.  Changes in how your medicines work in your body.  A depressed mood.  Difficulty sleeping (insomnia). After the first 2-3 weeks of quitting, you may start to notice more positive results, such as:  Improved sense of smell and taste.  Decreased coughing and sore throat.  Slower heart rate.  Lower blood pressure.  Clearer skin.  The ability to breathe more easily.  Fewer sick days. Quitting smoking is very challenging for most people. Do not get discouraged if you are not successful the first time. Some people need to make many attempts to quit before they achieve long-term success. Do your best to stick to your quit plan, and talk with your health care provider if you have any questions or concerns.   This information is not intended to replace advice given to you by your health care provider. Make sure you discuss any questions you have with your health care provider.   Document Released: 11/18/2001 Document Revised: 04/10/2015 Document Reviewed: 04/10/2015 Elsevier Interactive Patient Education [employer].

## 2016-09-17 ENCOUNTER — Telehealth: Payer: Self-pay | Admitting: Vascular Surgery

## 2016-10-08 NOTE — Telephone Encounter (Signed)
Pt's wife called September 29, 2016 to say pt passed away on Sep 28, 2016. He was patient of Dr. Kellie Simmering and has an appointment on Jan. 02.2018.

## 2016-10-08 DEATH — deceased

## 2016-12-09 ENCOUNTER — Ambulatory Visit: Payer: Medicare HMO | Admitting: Family

## 2016-12-09 ENCOUNTER — Encounter (HOSPITAL_COMMUNITY): Payer: Medicare HMO

## 2016-12-09 ENCOUNTER — Other Ambulatory Visit (HOSPITAL_COMMUNITY): Payer: Medicare HMO
